# Patient Record
Sex: Female | Born: 1975 | Race: White | Hispanic: No | Marital: Married | Smoking: Former smoker
Health system: Southern US, Community
[De-identification: ages and names within clinical notes are randomized; demographics above are authoritative.]

## PROBLEM LIST (undated history)

## (undated) DIAGNOSIS — G629 Polyneuropathy, unspecified: Secondary | ICD-10-CM

## (undated) DIAGNOSIS — J209 Acute bronchitis, unspecified: Secondary | ICD-10-CM

## (undated) DIAGNOSIS — E559 Vitamin D deficiency, unspecified: Secondary | ICD-10-CM

## (undated) DIAGNOSIS — R55 Syncope and collapse: Secondary | ICD-10-CM

## (undated) DIAGNOSIS — I10 Essential (primary) hypertension: Secondary | ICD-10-CM

## (undated) DIAGNOSIS — E119 Type 2 diabetes mellitus without complications: Secondary | ICD-10-CM

## (undated) DIAGNOSIS — R42 Dizziness and giddiness: Secondary | ICD-10-CM

## (undated) DIAGNOSIS — R071 Chest pain on breathing: Secondary | ICD-10-CM

## (undated) DIAGNOSIS — M40209 Unspecified kyphosis, site unspecified: Secondary | ICD-10-CM

## (undated) DIAGNOSIS — T8859XA Other complications of anesthesia, initial encounter: Secondary | ICD-10-CM

## (undated) DIAGNOSIS — R0602 Shortness of breath: Secondary | ICD-10-CM

## (undated) DIAGNOSIS — K469 Unspecified abdominal hernia without obstruction or gangrene: Secondary | ICD-10-CM

## (undated) DIAGNOSIS — N939 Abnormal uterine and vaginal bleeding, unspecified: Secondary | ICD-10-CM

## (undated) DIAGNOSIS — M069 Rheumatoid arthritis, unspecified: Secondary | ICD-10-CM

## (undated) HISTORY — DX: Chest pain on breathing: R07.1

## (undated) HISTORY — DX: Abnormal uterine and vaginal bleeding, unspecified: N93.9

## (undated) HISTORY — DX: Morbid (severe) obesity due to excess calories: E66.01

## (undated) HISTORY — DX: Acute bronchitis, unspecified: J20.9

## (undated) HISTORY — DX: Dizziness and giddiness: R42

## (undated) HISTORY — PX: SKIN TAG REMOVAL: SHX780

## (undated) HISTORY — PX: APPENDECTOMY: SHX54

## (undated) HISTORY — PX: OTHER SURGICAL HISTORY: SHX169

## (undated) HISTORY — DX: Polyneuropathy, unspecified: G62.9

## (undated) HISTORY — DX: Shortness of breath: R06.02

## (undated) HISTORY — DX: Vitamin D deficiency, unspecified: E55.9

## (undated) HISTORY — PX: CHOLECYSTECTOMY: SHX55

## (undated) HISTORY — PX: HERNIA REPAIR: SHX51

## (undated) HISTORY — PX: TONSILLECTOMY: SUR1361

## (undated) HISTORY — DX: Unspecified abdominal hernia without obstruction or gangrene: K46.9

## (undated) HISTORY — DX: Syncope and collapse: R55

---

## 2020-11-27 DIAGNOSIS — R55 Syncope and collapse: Secondary | ICD-10-CM

## 2020-11-29 DIAGNOSIS — R071 Chest pain on breathing: Secondary | ICD-10-CM | POA: Insufficient documentation

## 2020-11-29 DIAGNOSIS — R55 Syncope and collapse: Secondary | ICD-10-CM | POA: Insufficient documentation

## 2020-11-29 DIAGNOSIS — R0602 Shortness of breath: Secondary | ICD-10-CM | POA: Insufficient documentation

## 2020-11-29 DIAGNOSIS — J209 Acute bronchitis, unspecified: Secondary | ICD-10-CM | POA: Insufficient documentation

## 2020-11-29 DIAGNOSIS — R42 Dizziness and giddiness: Secondary | ICD-10-CM | POA: Insufficient documentation

## 2020-11-29 DIAGNOSIS — G629 Polyneuropathy, unspecified: Secondary | ICD-10-CM | POA: Insufficient documentation

## 2020-11-29 DIAGNOSIS — K469 Unspecified abdominal hernia without obstruction or gangrene: Secondary | ICD-10-CM | POA: Insufficient documentation

## 2020-11-29 DIAGNOSIS — N939 Abnormal uterine and vaginal bleeding, unspecified: Secondary | ICD-10-CM | POA: Insufficient documentation

## 2020-11-29 DIAGNOSIS — E559 Vitamin D deficiency, unspecified: Secondary | ICD-10-CM | POA: Insufficient documentation

## 2020-12-04 ENCOUNTER — Other Ambulatory Visit: Payer: Self-pay

## 2020-12-04 ENCOUNTER — Encounter: Payer: Self-pay | Admitting: Cardiology

## 2020-12-04 ENCOUNTER — Ambulatory Visit (INDEPENDENT_AMBULATORY_CARE_PROVIDER_SITE_OTHER): Payer: 59 | Admitting: Cardiology

## 2020-12-04 VITALS — BP 131/85 | HR 89 | Ht 66.0 in | Wt 251.0 lb

## 2020-12-04 DIAGNOSIS — R55 Syncope and collapse: Secondary | ICD-10-CM | POA: Diagnosis not present

## 2020-12-04 DIAGNOSIS — R079 Chest pain, unspecified: Secondary | ICD-10-CM | POA: Insufficient documentation

## 2020-12-04 DIAGNOSIS — I209 Angina pectoris, unspecified: Secondary | ICD-10-CM | POA: Diagnosis not present

## 2020-12-04 HISTORY — DX: Chest pain, unspecified: R07.9

## 2020-12-04 MED ORDER — ASPIRIN EC 81 MG PO TBEC: 81.0000 mg | DELAYED_RELEASE_TABLET | Freq: Every day | ORAL | 3 refills | Status: AC

## 2020-12-04 MED ORDER — NITROGLYCERIN 0.4 MG SL SUBL: 0.4000 mg | SUBLINGUAL_TABLET | SUBLINGUAL | 6 refills | Status: DC | PRN

## 2020-12-04 NOTE — Patient Instructions (Signed)
Medication Instructions:  Your physician has recommended you make the following change in your medication:   Take 81 mg coated aspirin daily. Use nitroglycerin as needed for chest pain.  *If you need a refill on your cardiac medications before your next appointment, please call your pharmacy*   Lab Work: Your physician recommends that you have a BMET and CBC today in the office for your upcoming procedure.  If you have labs (blood work) drawn today and your tests are completely normal, you will receive your results only by: Marland Kitchen MyChart Message (if you have MyChart) OR . A paper copy in the mail If you have any lab test that is abnormal or we need to change your treatment, we will call you to review the results.   Testing/Procedures:    [facility] [place] [address] [place] [address] Dept: [phone] Loc: [phone]  Sarah Rocha  12/04/2020  You are scheduled for a Cardiac Catheterization on Tuesday, February 15 with Dr. Nicki Guadalajara.  1. Please arrive at the [facility] (Main Entrance A) at [facility]: [address] [place], [place] [address] at 8:30 AM (This time is two hours before your procedure to ensure your preparation). Free valet parking service is available.   Special note: Every effort is made to have your procedure done on time. Please understand that emergencies sometimes delay scheduled procedures.  2. Diet: Do not eat solid foods after midnight.  The patient may have clear liquids until 5am upon the day of the procedure.  3. Labs: You had your labs done in the office today.  4. Medication instructions in preparation for your procedure:   Contrast Allergy: No  On the morning of your procedure, take your Aspirin and any morning medicines NOT listed above.  You may use sips of water.  5. Plan for one night stay--bring personal belongings. 6. Bring a current list of  your medications and current insurance cards. 7. You MUST have a responsible person to drive you home. 8. Someone MUST be with you the first 24 hours after you arrive home or your discharge will be delayed. 9. Please wear clothes that are easy to get on and off and wear slip-on shoes.  Thank you for allowing [place] to care for you!   -- [facility]    Follow-Up: At [facility], you and your health needs are our priority.  As part of our continuing mission to provide you with exceptional heart care, we have created designated Provider Care Teams.  These Care Teams include your primary Cardiologist (physician) and Advanced Practice Providers (APPs -  Physician Assistants and Nurse Practitioners) who all work together to provide you with the care you need, when you need it.  We recommend signing up for the patient portal called "MyChart".  Sign up information is provided on this After Visit Summary.  MyChart is used to connect with patients for Virtual Visits (Telemedicine).  Patients are able to view lab/test results, encounter notes, upcoming appointments, etc.  Non-urgent messages can be sent to your provider as well.   To learn more about what you can do with MyChart, go to [email or web address].    Your next appointment:   1 month(s)  The format for your next appointment:   In Person  Provider:   Belva Crome, MD   Other Instructions  Coronary Angiogram With Stent Coronary angiogram with stent placement is a procedure to  widen or open a narrow blood vessel of the heart (coronary artery). Arteries may become blocked by cholesterol buildup (plaques) in the lining of the artery wall. When a coronary artery becomes partially blocked, blood flow to that area decreases. This may lead to chest pain or a heart attack (myocardial infarction). A stent is a small piece of metal that looks like mesh or spring. Stent placement may be done as treatment after a  heart attack, or to prevent a heart attack if a blocked artery is found by a coronary angiogram. Let your health care provider know about:  Any allergies you have, including allergies to medicines or contrast dye.  All medicines you are taking, including vitamins, herbs, eye drops, creams, and over-the-counter medicines.  Any problems you or family members have had with anesthetic medicines.  Any blood disorders you have.  Any surgeries you have had.  Any medical conditions you have, including kidney problems or kidney failure.  Whether you are pregnant or may be pregnant.  Whether you are breastfeeding. What are the risks? Generally, this is a safe procedure. However, serious problems may occur, including:  Damage to nearby structures or organs, such as the heart, blood vessels, or kidneys.  A return of blockage.  Bleeding, infection, or bruising at the insertion site.  A collection of blood under the skin (hematoma) at the insertion site.  A blood clot in another part of the body.  Allergic reaction to medicines or dyes.  Bleeding into the abdomen (retroperitoneal bleeding).  Stroke (rare).  Heart attack (rare). What happens before the procedure? Staying hydrated Follow instructions from your health care provider about hydration, which may include:  Up to 2 hours before the procedure - you may continue to drink clear liquids, such as water, clear fruit juice, black coffee, and plain tea.   Eating and drinking restrictions Follow instructions from your health care provider about eating and drinking, which may include:  8 hours before the procedure - stop eating heavy meals or foods, such as meat, fried foods, or fatty foods.  6 hours before the procedure - stop eating light meals or foods, such as toast or cereal.  2 hours before the procedure - stop drinking clear liquids. Medicines Ask your health care provider about:  Changing or stopping your regular  medicines. This is especially important if you are taking diabetes medicines or blood thinners.  Taking medicines such as aspirin and ibuprofen. These medicines can thin your blood. Do not take these medicines unless your health care provider tells you to take them. ? Generally, aspirin is recommended before a thin tube, called a catheter, is passed through a blood vessel and inserted into the heart (cardiac catheterization).  Taking over-the-counter medicines, vitamins, herbs, and supplements. General instructions  Do not use any products that contain nicotine or tobacco for at least 4 weeks before the procedure. These products include cigarettes, e-cigarettes, and chewing tobacco. If you need help quitting, ask your health care provider.  Plan to have someone take you home from the hospital or clinic.  If you will be going home right after the procedure, plan to have someone with you for 24 hours.  You may have tests and imaging procedures.  Ask your health care provider: ? How your insertion site will be marked. Ask which artery will be used for the procedure. ? What steps will be taken to help prevent infection. These may include:  Removing hair at the insertion site.  Washing skin with  a germ-killing soap.  Taking antibiotic medicine. What happens during the procedure?  An IV will be inserted into one of your veins.  Electrodes may be placed on your chest to monitor your heart rate during the procedure.  You will be given one or more of the following: ? A medicine to help you relax (sedative). ? A medicine to numb the area (local anesthetic) for catheter insertion.  A small incision will be made for catheter insertion.  The catheter will be inserted into an artery using a guide wire. The location may be in your groin, your wrist, or the fold of your arm (near your elbow).  An X-ray procedure (fluoroscopy) will be used to help guide the catheter to the opening of the heart  arteries.  A dye will be injected into the catheter. X-rays will be taken. The dye helps to show where any narrowing or blockages are located in the arteries.  Tell your health care provider if you have chest pain or trouble breathing.  A tiny wire will be guided to the blocked spot, and a balloon will be inflated to make the artery wider.  The stent will be expanded to crush the plaques into the wall of the vessel. The stent will hold the area open and improve the blood flow. Most stents have a drug coating to reduce the risk of the stent narrowing over time.  The artery may be made wider using a drill, laser, or other tools that remove plaques.  The catheter will be removed when the blood flow improves. The stent will stay where it was placed, and the lining of the artery will grow over it.  A bandage (dressing) will be placed on the insertion site. Pressure will be applied to stop bleeding.  The IV will be removed. This procedure may vary among health care providers and hospitals.   What happens after the procedure?  Your blood pressure, heart rate, breathing rate, and blood oxygen level will be monitored until you leave the hospital or clinic.  If the procedure is done through the leg, you will lie flat in bed for a few hours or for as long as told by your health care provider. You will be instructed not to bend or cross your legs.  The insertion site and the pulse in your foot or wrist will be checked often.  You may have more blood tests, X-rays, and a test that records the electrical activity of your heart (electrocardiogram, or ECG).  Do not drive for 24 hours if you were given a sedative during your procedure. Summary  Coronary angiogram with stent placement is a procedure to widen or open a narrowed coronary artery. This is done to treat heart problems.  Before the procedure, let your health care provider know about all the medical conditions and surgeries you have or have  had.  This is a safe procedure. However, some problems may occur, including damage to nearby structures or organs, bleeding, blood clots, or allergies.  Follow your health care provider's instructions about eating, drinking, medicines, and other lifestyle changes, such as quitting tobacco use before the procedure. This information is not intended to replace advice given to you by your health care provider. Make sure you discuss any questions you have with your health care provider. Document Revised: 05/03/2019 Document Reviewed: 05/03/2019 Elsevier Patient Education  2021 Elsevier Inc.  Aspirin and Your Heart Aspirin is a medicine that prevents the platelets in your blood from sticking together. Platelets  are the cells that your blood uses for clotting. Aspirin can be used to help reduce the risk of blood clots, heart attacks, and other heart-related problems. What are the risks? Daily use of aspirin can cause side effects. Some of these include:  Bleeding. Bleeding can be minor or serious. An example of minor bleeding is bleeding from a cut, and the bleeding does not stop. An example of more serious bleeding is stomach bleeding or, rarely, bleeding into the brain. Your risk of bleeding increases if you are also taking NSAIDs, such as ibuprofen.  Increased bruising.  Upset stomach.  An allergic reaction. People who have growths inside the nose (nasal polyps) have an increased risk of developing an aspirin allergy. How to use aspirin to care for your heart  Take aspirin only as told by your health care provider. Make sure that you understand how much to take and what form to take. The two forms of aspirin are: ? Non-enteric-coated.This type of aspirin does not have a coating and is absorbed quickly. This type of aspirin also comes in a chewable form. ? Enteric-coated. This type of aspirin has a coating that releases the medicine very slowly. Enteric-coated aspirin might cause less stomach  upset than non-enteric-coated aspirin. This type of aspirin should not be chewed or crushed.  Work with your health care provider to find out whether it is safe and beneficial for you to take aspirin daily. Taking aspirin daily may be helpful if: ? You have had a heart attack or chest pain, or you are at risk for a heart attack. ? You have a condition in which certain heart vessels are blocked (coronary artery disease), and you have had a procedure to treat it. Examples are:  Open-heart surgery, such as coronary artery bypass surgery (CABG).  Coronary angioplasty,which is done to widen a blood vessel of your heart.  Having a small mesh tube, or stent, placed in your coronary artery. ? You have had certain types of stroke or a mini-stroke known as a transient ischemic attack (TIA). ? You have a narrowing of the arteries that supply the limbs (peripheral artery disease, or PAD). ? You have long-term (chronic) heart rhythm problems, such as atrial fibrillation, and your health care provider thinks aspirin may help. ? You have valve disease or have had surgery on a valve. ? You are considered at increased risk of developing coronary artery disease or PAD.   Follow these instructions at home Medicines  Take over-the-counter and prescription medicines only as told by your health care provider.  If you are taking blood thinners: ? Talk with your health care provider before you take any medicines that contain aspirin or NSAIDs, such as ibuprofen. These medicines increase your risk for dangerous bleeding. ? Take your medicine exactly as told, at the same time every day. ? Avoid activities that could cause injury or bruising, and follow instructions about how to prevent falls. ? Wear a medical alert bracelet or carry a card that lists what medicines you take. General instructions  Do not drink alcohol if: ? Your health care provider tells you not to drink. ? You are pregnant, may be pregnant, or  are planning to become pregnant.  If you drink alcohol: ? Limit how much you use to:  0-1 drink a day for women.  0-2 drinks a day for men. ? Be aware of how much alcohol is in your drink. In the [place] one drink equals one 12 oz bottle of beer (  355 mL), one 5 oz glass of wine (148 mL), or one 1 oz glass of hard liquor (44 mL).  Keep all follow-up visits as told by your health care provider. This is important. Where to find more information  The American Heart Association: www.heart.org Contact a health care provider if you have:  Unusual bleeding or bruising.  Stomach pain or nausea.  Ringing in your ears.  An allergic reaction that causes hives, itchy skin, or swelling of the lips, tongue, or face. Get help right away if:  You notice that your bowel movements are bloody, or dark red or black in color.  You vomit or cough up blood.  You have blood in your urine.  You cough, breathe loudly (wheeze), or feel short of breath.  You have chest pain, especially if the pain spreads to your arms, back, neck, or jaw.  You have a headache with confusion. You have any symptoms of a stroke. "BE FAST" is an easy way to remember the main warning signs of a stroke:  B - Balance. Signs are dizziness, sudden trouble walking, or loss of balance.  E - Eyes. Signs are trouble seeing or a sudden change in vision.  F - Face. Signs are sudden weakness or numbness of the face, or the face or eyelid drooping on one side.  A - Arms. Signs are weakness or numbness in an arm. This happens suddenly and usually on one side of the body.  S - Speech. Signs are sudden trouble speaking, slurred speech, or trouble understanding what people say.  T - Time. Time to call emergency services. Write down what time symptoms started. You have other signs of a stroke, such as:  A sudden, severe headache with no known cause.  Nausea or vomiting.  Seizure. These symptoms may represent a serious problem  that is an emergency. Do not wait to see if the symptoms will go away. Get medical help right away. Call your local emergency services (911 in the [place] Do not drive yourself to the hospital. Summary  Aspirin use can help reduce the risk of blood clots, heart attacks, and other heart-related problems.  Daily use of aspirin can cause side effects.  Take aspirin only as told by your health care provider. Make sure that you understand how much to take and what form to take.  Your health care provider will help you determine whether it is safe and beneficial for you to take aspirin daily. This information is not intended to replace advice given to you by your health care provider. Make sure you discuss any questions you have with your health care provider. Document Revised: 07/17/2019 Document Reviewed: 07/17/2019 Elsevier Patient Education  2021 Elsevier Inc. Nitroglycerin sublingual tablets What is this medicine? NITROGLYCERIN (nye troe GLI ser in) is a type of vasodilator. It relaxes blood vessels, increasing the blood and oxygen supply to your heart. This medicine is used to relieve chest pain caused by angina. It is also used to prevent chest pain before activities like climbing stairs, going outdoors in cold weather, or sexual activity. This medicine may be used for other purposes; ask your health care provider or pharmacist if you have questions. COMMON BRAND NAME(S): Nitroquick, Nitrostat, Nitrotab What should I tell my health care provider before I take this medicine? They need to know if you have any of these conditions:  anemia  head injury, recent stroke, or bleeding in the brain  liver disease  previous heart attack  an unusual  or allergic reaction to nitroglycerin, other medicines, foods, dyes, or preservatives  pregnant or trying to get pregnant  breast-feeding How should I use this medicine? Take this medicine by mouth as needed. Use at the first sign of an angina  attack (chest pain or tightness). You can also take this medicine 5 to 10 minutes before an event likely to produce chest pain. Follow the directions exactly as written on the prescription label. Place one tablet under your tongue and let it dissolve. Do not swallow whole. Replace the dose if you accidentally swallow it. It will help if your mouth is not dry. Saliva around the tablet will help it to dissolve more quickly. Do not eat or drink, smoke or chew tobacco while a tablet is dissolving. Sit down when taking this medicine. In an angina attack, you should feel better within 5 minutes after your first dose. You can take a dose every 5 minutes up to a total of 3 doses. If you do not feel better or feel worse after 1 dose, call 9-1-1 at once. Do not take more than 3 doses in 15 minutes. Your health care provider might give you other directions. Follow those directions if he or she does. Do not take your medicine more often than directed. Talk to your health care provider about the use of this medicine in children. Special care may be needed. Overdosage: If you think you have taken too much of this medicine contact a poison control center or emergency room at once. NOTE: This medicine is only for you. Do not share this medicine with others. What if I miss a dose? This does not apply. This medicine is only used as needed. What may interact with this medicine? Do not take this medicine with any of the following medications:  certain migraine medicines like ergotamine and dihydroergotamine (DHE)  medicines used to treat erectile dysfunction like sildenafil, tadalafil, and vardenafil  riociguat This medicine may also interact with the following medications:  alteplase  aspirin  heparin  medicines for high blood pressure  medicines for mental depression  other medicines used to treat angina  phenothiazines like chlorpromazine, mesoridazine, prochlorperazine, thioridazine This list may not  describe all possible interactions. Give your health care provider a list of all the medicines, herbs, non-prescription drugs, or dietary supplements you use. Also tell them if you smoke, drink alcohol, or use illegal drugs. Some items may interact with your medicine. What should I watch for while using this medicine? Tell your doctor or health care professional if you feel your medicine is no longer working. Keep this medicine with you at all times. Sit or lie down when you take your medicine to prevent falling if you feel dizzy or faint after using it. Try to remain calm. This will help you to feel better faster. If you feel dizzy, take several deep breaths and lie down with your feet propped up, or bend forward with your head resting between your knees. You may get drowsy or dizzy. Do not drive, use machinery, or do anything that needs mental alertness until you know how this drug affects you. Do not stand or sit up quickly, especially if you are an older patient. This reduces the risk of dizzy or fainting spells. Alcohol can make you more drowsy and dizzy. Avoid alcoholic drinks. Do not treat yourself for coughs, colds, or pain while you are taking this medicine without asking your doctor or health care professional for advice. Some ingredients may increase your  blood pressure. What side effects may I notice from receiving this medicine? Side effects that you should report to your doctor or health care professional as soon as possible:  allergic reactions (skin rash, itching or hives; swelling of the face, lips, or tongue)  low blood pressure (dizziness; feeling faint or lightheaded, falls; unusually weak or tired)  low red blood cell counts (trouble breathing; feeling faint; lightheaded, falls; unusually weak or tired) Side effects that usually do not require medical attention (report to your doctor or health care professional if they continue or are bothersome):  facial flushing  (redness)  headache  nausea, vomiting This list may not describe all possible side effects. Call your doctor for medical advice about side effects. You may report side effects to FDA at 1-800-FDA-1088. Where should I keep my medicine? Keep out of the reach of children. Store at room temperature between 20 and 25 degrees C (68 and 77 degrees F). Store in Retail buyer. Protect from light and moisture. Keep tightly closed. Throw away any unused medicine after the expiration date. NOTE: This sheet is a summary. It may not cover all possible information. If you have questions about this medicine, talk to your doctor, pharmacist, or health care provider.  2021 Elsevier/Gold Standard (2018-07-13 16:46:32)

## 2020-12-04 NOTE — Progress Notes (Addendum)
Cardiology Office Note:    Date:  12/04/2020   ID:  Sarah Rocha, DOB [birth year], MRN [id number]  PCP:  Yvonne Kendall, NP  Cardiologist:  Garwin Brothers, MD   Referring MD: Yvonne Kendall, NP    ASSESSMENT:    1. Syncope and collapse   2. Angina pectoris (HCC)   3. Morbid obesity (HCC)    PLAN:    In order of problems listed above:  1. Angina pectoris: Patient symptoms are very significant and following recommendations were made to the patient.  Sublingual nitroglycerin prescription was sent, its protocol and 911 protocol explained and the patient vocalized understanding questions were answered to the patient's satisfaction.  She was advised to take a coated baby aspirin on a daily basis.In view of the patient's symptoms, I discussed with the patient options for evaluation. Invasive and noninvasive options were given to the patient. I discussed stress testing and coronary angiography and left heart catheterization at length. Benefits, pros and cons of each approach were discussed at length. Patient had multiple questions which were answered to the patient's satisfaction. Patient opted for invasive evaluation and we will set up for coronary angiography and left heart catheterization. Further recommendations will be made based on the findings with coronary angiography. In the interim if the patient has any significant symptoms in hospital to the nearest emergency room.  I also discussed CT coronary angiography with FFR and she is working on this modality. 2. Morbid obesity: Diet was emphasized.  Weight reduction.  Stressed and she promises to do better.  Risks of obesity explained and she promises to do better. 3. Ex-smoker: She promises never to go back to smoking. 4. She has had blood work done by primary care provider lipids are followed by them.  Diet was emphasized.  She will be seen follow-up appointment after the coronary angiography.  Addendum 01/04/2019 Patient came for  follow-up visit with me today and I reviewed my previous note and realized that is a typo error.  I am putting an addendum to that note as a correction.  The correct sentence must be: I also discussed CT angiography with FFR and she is not keen on this modality. Signed: Dr. Glean Hess Doral Digangi 01/04/2019 at 9:11 AM   Medication Adjustments/Labs and Tests Ordered: Current medicines are reviewed at length with the patient today.  Concerns regarding medicines are outlined above.  No orders of the defined types were placed in this encounter.  No orders of the defined types were placed in this encounter.    History of Present Illness:    Sarah Rocha is a 45 y.o. female who is being seen today for the evaluation of chest pain and chest tightness at the request of Yvonne Kendall, NP.  Patient is a pleasant 45 year old female.  She has past medical history of morbid obesity.  She is under: Gastric surgery for weight loss and has been successful with this.  She is an ex-smoker.  She has had history of cardiomyopathy and coronary angiography in the remote past in [place].  We do not have those details.  She mentions to me now that when she exerts herself she has chest tightness radiating to the left arm.  She is passed out on one occasion.  No orthopnea or PND.  For this reason she was sent here for evaluation.  She leads a sedentary lifestyle.  At the time of my evaluation, the patient is alert awake oriented and in no distress.  Past  Medical History:  Diagnosis Date  . Abdominal hernia   . Abnormal uterine and vaginal bleeding, unspecified   . Acute bronchitis, unspecified   . Chest pain on breathing   . Lightheadedness   . Morbid obesity (HCC)   . Neuropathy   . Shortness of breath   . Syncope and collapse   . Vitamin D deficiency     Past Surgical History:  Procedure Laterality Date  . APPENDECTOMY    . CESAREAN SECTION    . CHOLECYSTECTOMY    . gastric bypass sleeve    . HERNIA REPAIR     . SKIN TAG REMOVAL    . TONSILLECTOMY      Current Medications: Current Meds  Medication Sig  . albuterol (VENTOLIN HFA) 108 (90 Base) MCG/ACT inhaler Inhale 2 puffs into the lungs every 6 (six) hours as needed for wheezing.  . Cholecalciferol (VITAMIN D3) 1.25 MG (50000 UT) CAPS Take 50,000 Units by mouth once a week.  . gabapentin (NEURONTIN) 400 MG capsule Take 400 mg by mouth at bedtime.     Allergies:   Patient has no known allergies.   Social History   Socioeconomic History  . Marital status: Single    Spouse name: Not on file  . Number of children: Not on file  . Years of education: Not on file  . Highest education level: Not on file  Occupational History  . Not on file  Tobacco Use  . Smoking status: Former Games developer  . Smokeless tobacco: Never Used  Substance and Sexual Activity  . Alcohol use: Not on file  . Drug use: Not on file  . Sexual activity: Not on file  Other Topics Concern  . Not on file  Social History Narrative  . Not on file   Social Determinants of Health   Financial Resource Strain: Not on file  Food Insecurity: Not on file  Transportation Needs: Not on file  Physical Activity: Not on file  Stress: Not on file  Social Connections: Not on file     Family History: The patient's family history includes Diabetes in her father and mother; Heart attack in her father and mother; Hypertension in her father.  ROS:   Please see the history of present illness.    All other systems reviewed and are negative.  EKGs/Labs/Other Studies Reviewed:    The following studies were reviewed today: I discussed my findings with the patient at length.  EKG reveals sinus rhythm and nonspecific ST-T changes.   Recent Labs: No results found for requested labs within last 8760 hours.  Recent Lipid Panel No results found for: CHOL, TRIG, HDL, CHOLHDL, VLDL, LDLCALC, LDLDIRECT  Physical Exam:    VS:  BP 131/85   Pulse 89   Ht 5\' 6"  (1.676 m)   Wt 251 lb  (113.9 kg)   SpO2 95%   BMI 40.51 kg/m     Wt Readings from Last 3 Encounters:  12/04/20 251 lb (113.9 kg)     GEN: Patient is in no acute distress HEENT: Normal NECK: No JVD; No carotid bruits LYMPHATICS: No lymphadenopathy CARDIAC: S1 S2 regular, 2/6 systolic murmur at the apex. RESPIRATORY:  Clear to auscultation without rales, wheezing or rhonchi  ABDOMEN: Soft, non-tender, non-distended MUSCULOSKELETAL:  No edema; No deformity  SKIN: Warm and dry NEUROLOGIC:  Alert and oriented x 3 PSYCHIATRIC:  Normal affect    Signed, 02/01/21, MD  12/04/2020 10:08 AM    [facility]

## 2020-12-04 NOTE — H&P (View-Only) (Signed)
Cardiology Office Note:    Date:  12/04/2020   ID:  Sarah Rocha, DOB [birth year], MRN [id number]  PCP:  Yvonne Kendall, NP  Cardiologist:  Garwin Brothers, MD   Referring MD: Yvonne Kendall, NP    ASSESSMENT:    1. Syncope and collapse   2. Angina pectoris (HCC)   3. Morbid obesity (HCC)    PLAN:    In order of problems listed above:  1. Angina pectoris: Patient symptoms are very significant and following recommendations were made to the patient.  Sublingual nitroglycerin prescription was sent, its protocol and 911 protocol explained and the patient vocalized understanding questions were answered to the patient's satisfaction.  She was advised to take a coated baby aspirin on a daily basis.In view of the patient's symptoms, I discussed with the patient options for evaluation. Invasive and noninvasive options were given to the patient. I discussed stress testing and coronary angiography and left heart catheterization at length. Benefits, pros and cons of each approach were discussed at length. Patient had multiple questions which were answered to the patient's satisfaction. Patient opted for invasive evaluation and we will set up for coronary angiography and left heart catheterization. Further recommendations will be made based on the findings with coronary angiography. In the interim if the patient has any significant symptoms in hospital to the nearest emergency room.  I also discussed CT coronary angiography with FFR and she is working on this modality. 2. Morbid obesity: Diet was emphasized.  Weight reduction.  Stressed and she promises to do better.  Risks of obesity explained and she promises to do better. 3. Ex-smoker: She promises never to go back to smoking. 4. She has had blood work done by primary care provider lipids are followed by them.  Diet was emphasized.  She will be seen follow-up appointment after the coronary angiography.   Medication Adjustments/Labs and Tests  Ordered: Current medicines are reviewed at length with the patient today.  Concerns regarding medicines are outlined above.  No orders of the defined types were placed in this encounter.  No orders of the defined types were placed in this encounter.    History of Present Illness:    Sarah Rocha is a 45 y.o. female who is being seen today for the evaluation of chest pain and chest tightness at the request of Yvonne Kendall, NP.  Patient is a pleasant 45 year old female.  She has past medical history of morbid obesity.  She is under: Gastric surgery for weight loss and has been successful with this.  She is an ex-smoker.  She has had history of cardiomyopathy and coronary angiography in the remote past in [place].  We do not have those details.  She mentions to me now that when she exerts herself she has chest tightness radiating to the left arm.  She is passed out on one occasion.  No orthopnea or PND.  For this reason she was sent here for evaluation.  She leads a sedentary lifestyle.  At the time of my evaluation, the patient is alert awake oriented and in no distress.  Past Medical History:  Diagnosis Date  . Abdominal hernia   . Abnormal uterine and vaginal bleeding, unspecified   . Acute bronchitis, unspecified   . Chest pain on breathing   . Lightheadedness   . Morbid obesity (HCC)   . Neuropathy   . Shortness of breath   . Syncope and collapse   . Vitamin D deficiency  Past Surgical History:  Procedure Laterality Date  . APPENDECTOMY    . CESAREAN SECTION    . CHOLECYSTECTOMY    . gastric bypass sleeve    . HERNIA REPAIR    . SKIN TAG REMOVAL    . TONSILLECTOMY      Current Medications: Current Meds  Medication Sig  . albuterol (VENTOLIN HFA) 108 (90 Base) MCG/ACT inhaler Inhale 2 puffs into the lungs every 6 (six) hours as needed for wheezing.  . Cholecalciferol (VITAMIN D3) 1.25 MG (50000 UT) CAPS Take 50,000 Units by mouth once a week.  . gabapentin  (NEURONTIN) 400 MG capsule Take 400 mg by mouth at bedtime.     Allergies:   Patient has no known allergies.   Social History   Socioeconomic History  . Marital status: Single    Spouse name: Not on file  . Number of children: Not on file  . Years of education: Not on file  . Highest education level: Not on file  Occupational History  . Not on file  Tobacco Use  . Smoking status: Former Games developer  . Smokeless tobacco: Never Used  Substance and Sexual Activity  . Alcohol use: Not on file  . Drug use: Not on file  . Sexual activity: Not on file  Other Topics Concern  . Not on file  Social History Narrative  . Not on file   Social Determinants of Health   Financial Resource Strain: Not on file  Food Insecurity: Not on file  Transportation Needs: Not on file  Physical Activity: Not on file  Stress: Not on file  Social Connections: Not on file     Family History: The patient's family history includes Diabetes in her father and mother; Heart attack in her father and mother; Hypertension in her father.  ROS:   Please see the history of present illness.    All other systems reviewed and are negative.  EKGs/Labs/Other Studies Reviewed:    The following studies were reviewed today: I discussed my findings with the patient at length.  EKG reveals sinus rhythm and nonspecific ST-T changes.   Recent Labs: No results found for requested labs within last 8760 hours.  Recent Lipid Panel No results found for: CHOL, TRIG, HDL, CHOLHDL, VLDL, LDLCALC, LDLDIRECT  Physical Exam:    VS:  BP 131/85   Pulse 89   Ht 5\' 6"  (1.676 m)   Wt 251 lb (113.9 kg)   SpO2 95%   BMI 40.51 kg/m     Wt Readings from Last 3 Encounters:  12/04/20 251 lb (113.9 kg)     GEN: Patient is in no acute distress HEENT: Normal NECK: No JVD; No carotid bruits LYMPHATICS: No lymphadenopathy CARDIAC: S1 S2 regular, 2/6 systolic murmur at the apex. RESPIRATORY:  Clear to auscultation without rales,  wheezing or rhonchi  ABDOMEN: Soft, non-tender, non-distended MUSCULOSKELETAL:  No edema; No deformity  SKIN: Warm and dry NEUROLOGIC:  Alert and oriented x 3 PSYCHIATRIC:  Normal affect    Signed, 02/01/21, MD  12/04/2020 10:08 AM    [facility]

## 2020-12-05 LAB — CBC WITH DIFFERENTIAL/PLATELET
Basophils Absolute: 0.1 10*3/uL (ref 0.0–0.2)
Basos: 1 %
EOS (ABSOLUTE): 0.2 10*3/uL (ref 0.0–0.4)
Eos: 2 %
Hematocrit: 42.8 % (ref 34.0–46.6)
Hemoglobin: 13.7 g/dL (ref 11.1–15.9)
Immature Grans (Abs): 0.1 10*3/uL (ref 0.0–0.1)
Immature Granulocytes: 1 %
Lymphocytes Absolute: 2.3 10*3/uL (ref 0.7–3.1)
Lymphs: 27 %
MCH: 27.4 pg (ref 26.6–33.0)
MCHC: 32 g/dL (ref 31.5–35.7)
MCV: 86 fL (ref 79–97)
Monocytes Absolute: 0.9 10*3/uL (ref 0.1–0.9)
Monocytes: 10 %
Neutrophils Absolute: 5.2 10*3/uL (ref 1.4–7.0)
Neutrophils: 59 %
Platelets: 220 10*3/uL (ref 150–450)
RBC: 5 x10E6/uL (ref 3.77–5.28)
RDW: 14.3 % (ref 11.7–15.4)
WBC: 8.7 10*3/uL (ref 3.4–10.8)

## 2020-12-05 LAB — BASIC METABOLIC PANEL
BUN/Creatinine Ratio: 13 (ref 9–23)
BUN: 10 mg/dL (ref 6–24)
CO2: 23 mmol/L (ref 20–29)
Calcium: 9.7 mg/dL (ref 8.7–10.2)
Chloride: 104 mmol/L (ref 96–106)
Creatinine, Ser: 0.79 mg/dL (ref 0.57–1.00)
GFR calc Af Amer: 106 mL/min/{1.73_m2} (ref >59)
GFR calc non Af Amer: 92 mL/min/{1.73_m2} (ref >59)
Glucose: 87 mg/dL (ref 65–99)
Potassium: 4.5 mmol/L (ref 3.5–5.2)
Sodium: 141 mmol/L (ref 134–144)

## 2020-12-05 LAB — SPECIMEN STATUS REPORT

## 2020-12-07 ENCOUNTER — Other Ambulatory Visit (HOSPITAL_COMMUNITY)
Admission: RE | Admit: 2020-12-07 | Discharge: 2020-12-07 | Disposition: A | Discharge status: Home / Self Care | Payer: 59 | Source: Ambulatory Visit | Attending: Cardiovascular Disease | Admitting: Cardiovascular Disease

## 2020-12-07 DIAGNOSIS — Z20822 Contact with and (suspected) exposure to covid-19: Secondary | ICD-10-CM | POA: Diagnosis not present

## 2020-12-07 DIAGNOSIS — Z01812 Encounter for preprocedural laboratory examination: Secondary | ICD-10-CM | POA: Insufficient documentation

## 2020-12-08 LAB — SARS CORONAVIRUS 2 (TAT 6-24 HRS): SARS Coronavirus 2: NEGATIVE

## 2020-12-09 NOTE — Progress Notes (Signed)
Attempted to call patient regarding procedure instructions for tomorrow.  Left voice mail with the following instructions: Arrival time is 8:00 Nothing to eat or drink after midnight Need responsible adult to drive you home tomorrow as well as someone to stay overnight with you. Ok to take all am medications, including the baby asa

## 2020-12-10 ENCOUNTER — Encounter (HOSPITAL_COMMUNITY)
Admission: RE | Disposition: A | Discharge status: Home / Self Care | Payer: Self-pay | Source: Home / Self Care | Attending: Cardiovascular Disease

## 2020-12-10 ENCOUNTER — Other Ambulatory Visit: Payer: Self-pay

## 2020-12-10 ENCOUNTER — Ambulatory Visit (HOSPITAL_COMMUNITY)
Admission: RE | Admit: 2020-12-10 | Discharge: 2020-12-10 | Disposition: A | Discharge status: Home / Self Care | Payer: 59 | Attending: Cardiovascular Disease | Admitting: Cardiovascular Disease

## 2020-12-10 DIAGNOSIS — Z87891 Personal history of nicotine dependence: Secondary | ICD-10-CM | POA: Insufficient documentation

## 2020-12-10 DIAGNOSIS — I209 Angina pectoris, unspecified: Secondary | ICD-10-CM | POA: Insufficient documentation

## 2020-12-10 DIAGNOSIS — R079 Chest pain, unspecified: Secondary | ICD-10-CM

## 2020-12-10 DIAGNOSIS — Z79899 Other long term (current) drug therapy: Secondary | ICD-10-CM | POA: Insufficient documentation

## 2020-12-10 DIAGNOSIS — R55 Syncope and collapse: Secondary | ICD-10-CM | POA: Diagnosis not present

## 2020-12-10 DIAGNOSIS — I429 Cardiomyopathy, unspecified: Secondary | ICD-10-CM | POA: Diagnosis not present

## 2020-12-10 DIAGNOSIS — Z6841 Body Mass Index (BMI) 40.0 and over, adult: Secondary | ICD-10-CM | POA: Diagnosis not present

## 2020-12-10 HISTORY — PX: LEFT HEART CATH AND CORONARY ANGIOGRAPHY: CATH118249

## 2020-12-10 SURGERY — LEFT HEART CATH AND CORONARY ANGIOGRAPHY
Anesthesia: LOCAL

## 2020-12-10 MED ORDER — LIDOCAINE HCL (PF) 1 % IJ SOLN
INTRAMUSCULAR | Status: AC
  Filled 2020-12-10: qty 30

## 2020-12-10 MED ORDER — OXYCODONE-ACETAMINOPHEN 5-325 MG PO TABS
1.0000 | ORAL_TABLET | Freq: Once | ORAL | Status: AC
  Filled 2020-12-10: qty 1

## 2020-12-10 MED ORDER — VERAPAMIL HCL 2.5 MG/ML IV SOLN
INTRAVENOUS | Status: AC
  Filled 2020-12-10: qty 2

## 2020-12-10 MED ORDER — SODIUM CHLORIDE 0.9% FLUSH: 3.0000 mL | Freq: Two times a day (BID) | INTRAVENOUS | Status: DC

## 2020-12-10 MED ORDER — FENTANYL CITRATE (PF) 100 MCG/2ML IJ SOLN
INTRAMUSCULAR | Status: AC
  Filled 2020-12-10: qty 2

## 2020-12-10 MED ORDER — SODIUM CHLORIDE 0.9 % IV SOLN: 250.0000 mL | INTRAVENOUS | Status: DC | PRN

## 2020-12-10 MED ORDER — HEPARIN (PORCINE) IN NACL 1000-0.9 UT/500ML-% IV SOLN
INTRAVENOUS | Status: AC
  Filled 2020-12-10: qty 500

## 2020-12-10 MED ORDER — SODIUM CHLORIDE 0.9 % WEIGHT BASED INFUSION: 1.0000 mL/kg/h | INTRAVENOUS | Status: DC

## 2020-12-10 MED ORDER — SODIUM CHLORIDE 0.9 % WEIGHT BASED INFUSION: 3.0000 mL/kg/h | INTRAVENOUS | Status: AC

## 2020-12-10 MED ORDER — IOHEXOL 350 MG/ML SOLN
INTRAVENOUS | Status: DC | PRN
  Administered 2020-12-10: 50 mL

## 2020-12-10 MED ORDER — HEPARIN SODIUM (PORCINE) 1000 UNIT/ML IJ SOLN
INTRAMUSCULAR | Status: DC | PRN
  Administered 2020-12-10: 5500 [IU] via INTRAVENOUS

## 2020-12-10 MED ORDER — SODIUM CHLORIDE 0.9% FLUSH: 3.0000 mL | INTRAVENOUS | Status: DC | PRN

## 2020-12-10 MED ORDER — MIDAZOLAM HCL 2 MG/2ML IJ SOLN
INTRAMUSCULAR | Status: DC | PRN
  Administered 2020-12-10: 2 mg via INTRAVENOUS
  Administered 2020-12-10: 1 mg via INTRAVENOUS

## 2020-12-10 MED ORDER — SODIUM CHLORIDE 0.9 % IV SOLN
250.0000 mL | INTRAVENOUS | Status: DC | PRN
Start: 2020-12-10 — End: 2020-12-11

## 2020-12-10 MED ORDER — DIAZEPAM 5 MG PO TABS: 5.0000 mg | ORAL_TABLET | Freq: Four times a day (QID) | ORAL | Status: DC | PRN

## 2020-12-10 MED ORDER — LABETALOL HCL 5 MG/ML IV SOLN: 10.0000 mg | INTRAVENOUS | Status: DC | PRN

## 2020-12-10 MED ORDER — LIDOCAINE HCL (PF) 1 % IJ SOLN
INTRAMUSCULAR | Status: DC | PRN
  Administered 2020-12-10: 2 mL

## 2020-12-10 MED ORDER — SODIUM CHLORIDE 0.9 % IV SOLN: INTRAVENOUS | Status: AC

## 2020-12-10 MED ORDER — MIDAZOLAM HCL 2 MG/2ML IJ SOLN
INTRAMUSCULAR | Status: AC
  Filled 2020-12-10: qty 2

## 2020-12-10 MED ORDER — ACETAMINOPHEN 325 MG PO TABS: 650.0000 mg | ORAL_TABLET | ORAL | Status: DC | PRN

## 2020-12-10 MED ORDER — ONDANSETRON HCL 4 MG/2ML IJ SOLN: 4.0000 mg | Freq: Four times a day (QID) | INTRAMUSCULAR | Status: DC | PRN

## 2020-12-10 MED ORDER — OXYCODONE-ACETAMINOPHEN 5-325 MG PO TABS
ORAL_TABLET | ORAL | Status: AC
  Administered 2020-12-10: 1 via ORAL
  Filled 2020-12-10: qty 1

## 2020-12-10 MED ORDER — VERAPAMIL HCL 2.5 MG/ML IV SOLN
INTRAVENOUS | Status: DC | PRN
  Administered 2020-12-10: 10 mL via INTRA_ARTERIAL

## 2020-12-10 MED ORDER — HEPARIN (PORCINE) IN NACL 1000-0.9 UT/500ML-% IV SOLN
INTRAVENOUS | Status: DC | PRN
  Administered 2020-12-10 (×2): 500 mL

## 2020-12-10 MED ORDER — ASPIRIN 81 MG PO CHEW: 81.0000 mg | CHEWABLE_TABLET | ORAL | Status: DC

## 2020-12-10 MED ORDER — FENTANYL CITRATE (PF) 100 MCG/2ML IJ SOLN
INTRAMUSCULAR | Status: DC | PRN
  Administered 2020-12-10: 50 ug via INTRAVENOUS
  Administered 2020-12-10: 25 ug via INTRAVENOUS

## 2020-12-10 MED ORDER — ACETAMINOPHEN 500 MG PO TABS
1000.0000 mg | ORAL_TABLET | Freq: Four times a day (QID) | ORAL | Status: DC | PRN
  Administered 2020-12-10: 1000 mg via ORAL

## 2020-12-10 MED ORDER — HEPARIN SODIUM (PORCINE) 1000 UNIT/ML IJ SOLN
INTRAMUSCULAR | Status: AC
  Filled 2020-12-10: qty 1

## 2020-12-10 MED ORDER — HYDRALAZINE HCL 20 MG/ML IJ SOLN
10.0000 mg | INTRAMUSCULAR | Status: DC | PRN
Start: 2020-12-10 — End: 2020-12-10

## 2020-12-10 SURGICAL SUPPLY — 13 items
BAG SNAP BAND KOVER 36X36 (MISCELLANEOUS) ×2 IMPLANT
CATH INFINITI JR4 5F (CATHETERS) ×2 IMPLANT
CATH OPTITORQUE TIG 4.0 5F (CATHETERS) ×2 IMPLANT
COVER DOME SNAP 22 D (MISCELLANEOUS) ×2 IMPLANT
DEVICE RAD COMP TR BAND LRG (VASCULAR PRODUCTS) ×2 IMPLANT
GLIDESHEATH SLEND SS 6F .021 (SHEATH) ×2 IMPLANT
GUIDEWIRE INQWIRE 1.5J.035X260 (WIRE) ×1 IMPLANT
INQWIRE 1.5J .035X260CM (WIRE) ×2
KIT HEART LEFT (KITS) ×2 IMPLANT
PACK CARDIAC CATHETERIZATION (CUSTOM PROCEDURE TRAY) ×2 IMPLANT
SHEATH PROBE COVER 6X72 (BAG) ×2 IMPLANT
TRANSDUCER W/STOPCOCK (MISCELLANEOUS) ×2 IMPLANT
TUBING CIL FLEX 10 FLL-RA (TUBING) ×2 IMPLANT

## 2020-12-10 NOTE — Progress Notes (Signed)
Pt is much more comfortable now and reports pain is down to a 4. Con to deflate band per protocol.

## 2020-12-10 NOTE — Progress Notes (Signed)
   Pt c/o nerve pain running up her arm from the cath site.  Slightly better w/ Percocet, but says it does better if she can keep it elevated.  Plan: Give tylenol 1000 mg  Sling to keep it elevated for now Take home gabapentin as scheduled Call if sx do not improve in 24-48 hr.  She prefers rx and d/c to staying overnight.  Theodore Demark, PA-C 12/10/2020 6:03 PM

## 2020-12-10 NOTE — Progress Notes (Signed)
Pt arrived from cath lab crying. States pain in (R) lower arm and hand is 9 out of 10 stabbing  Pain. (R) hand dusky. Okey Regal in cath lab called to assess and states hand looks ok. 1 cc of air removed from TRB.  Color improved but pain did not.  Dr Tresa Endo notified and asked to come see pt.

## 2020-12-10 NOTE — Discharge Instructions (Signed)
Radial Site Care  This sheet gives you information about how to care for yourself after your procedure. Your health care provider may also give you more specific instructions. If you have problems or questions, contact your health care provider. What can I expect after the procedure? After the procedure, it is common to have:  Bruising and tenderness at the catheter insertion area. Follow these instructions at home: Medicines  Take over-the-counter and prescription medicines only as told by your health care provider. Insertion site care 1. Follow instructions from your health care provider about how to take care of your insertion site. Make sure you: ? Wash your hands with soap and water before you remove your bandage (dressing). If soap and water are not available, use hand sanitizer. ? May remove dressing in 24 hours. 2. Check your insertion site every day for signs of infection. Check for: ? Redness, swelling, or pain. ? Fluid or blood. ? Pus or a bad smell. ? Warmth. 3. Do no take baths, swim, or use a hot tub for 5 days. 4. You may shower 24-48 hours after the procedure. ? Remove the dressing and gently wash the site with plain soap and water. ? Pat the area dry with a clean towel. ? Do not rub the site. That could cause bleeding. 5. Do not apply powder or lotion to the site. Activity  1. For 24 hours after the procedure, or as directed by your health care provider: ? Do not flex or bend the affected arm. ? Do not push or pull heavy objects with the affected arm. ? Do not drive yourself home from the hospital or clinic. You may drive 24 hours after the procedure. ? Do not operate machinery or power tools. ? KEEP ARM ELEVATED THE REMAINDER OF THE DAY. 2. Do not push, pull or lift anything that is heavier than 10 lb for 5 days. 3. Ask your health care provider when it is okay to: ? Return to work or school. ? Resume usual physical activities or sports. ? Resume sexual  activity. General instructions  If the catheter site starts to bleed, raise your arm and put firm pressure on the site. If the bleeding does not stop, get help right away. This is a medical emergency.  DRINK PLENTY OF FLUIDS FOR THE NEXT 2-3 DAYS.  No alcohol consumption for 24 hours after receiving sedation.  If you went home on the same day as your procedure, a responsible adult should be with you for the first 24 hours after you arrive home.  Keep all follow-up visits as told by your health care provider. This is important. Contact a health care provider if:  You have a fever.  You have redness, swelling, or yellow drainage around your insertion site. Get help right away if:  You have unusual pain at the radial site.  The catheter insertion area swells very fast.  The insertion area is bleeding, and the bleeding does not stop when you hold steady pressure on the area.  Your arm or hand becomes pale, cool, tingly, or numb. These symptoms may represent a serious problem that is an emergency. Do not wait to see if the symptoms will go away. Get medical help right away. Call your local emergency services (911 in the [place] Do not drive yourself to the hospital. Summary  After the procedure, it is common to have bruising and tenderness at the site.  Follow instructions from your health care provider about how to take care   of your radial site wound. Check the wound every day for signs of infection.  This information is not intended to replace advice given to you by your health care provider. Make sure you discuss any questions you have with your health care provider. Document Revised: 11/17/2017 Document Reviewed: 11/17/2017 Elsevier Patient Education  2020 [employer] 

## 2020-12-10 NOTE — Progress Notes (Signed)
Harriett Meulyndyke,RN and Lindsey,NP here to assess pt. Pt still in tears with stabbing shooting pain shooting into lower(R)  arm and hand.

## 2020-12-10 NOTE — Progress Notes (Signed)
    Called to evaluate TR band/cath site. Patient reports sharp, nerve like pain proximal to TR band which extends about 2-3 inches as above pain. Hand was initially dusky but per RN improved after began deflating band. No hematoma noted at site/forearm. Will order some pain meds and RN to reassess as air removed from TR band.

## 2020-12-10 NOTE — Interval H&P Note (Signed)
Cath Lab Visit (complete for each Cath Lab visit)  Clinical Evaluation Leading to the Procedure:   ACS: No.  Non-ACS:    Anginal Classification: CCS III  Anti-ischemic medical therapy: Minimal Therapy (1 class of medications)  Non-Invasive Test Results: No non-invasive testing performed  Prior CABG: No previous CABG      History and Physical Interval Note:  12/10/2020 12:40 PM  Sarah Rocha  has presented today for surgery, with the diagnosis of angina.  The various methods of treatment have been discussed with the patient and family. After consideration of risks, benefits and other options for treatment, the patient has consented to  Procedure(s): LEFT HEART CATH AND CORONARY ANGIOGRAPHY (N/A) as a surgical intervention.  The patient's history has been reviewed, patient examined, no change in status, stable for surgery.  I have reviewed the patient's chart and labs.  Questions were answered to the patient's satisfaction.     Nicki Guadalajara

## 2020-12-10 NOTE — Progress Notes (Signed)
Pt waiting for Dr Tresa Endo to come and reassess.  Crying now and says pain is the same or worse than before.  Dr Tresa Endo notified.  Site remains unremarkable without bleeding hematoma.  Pt reports  that it feels just like the nerve pain in her legs. Like shooting electricity.

## 2020-12-11 ENCOUNTER — Encounter (HOSPITAL_COMMUNITY): Payer: Self-pay | Admitting: Cardiovascular Disease

## 2020-12-13 ENCOUNTER — Telehealth: Payer: Self-pay | Admitting: Cardiology

## 2020-12-13 DIAGNOSIS — M79601 Pain in right arm: Secondary | ICD-10-CM

## 2020-12-13 NOTE — Telephone Encounter (Signed)
Spoke with patient and advised her to go to the ED for treatment as we had not had any response from Dr. Tresa Endo or PA. Pt verbalized understanding and had no additional questions.

## 2020-12-13 NOTE — Telephone Encounter (Signed)
   Pt had a heart cath last 02/15 and the nurse told her during procedure they hit a nerve and up until today she doesn't have sensation on her hand and still in pain. She said she doesn't know what to do and would like to speak with Dr. Kem Parkinson nurse

## 2020-12-13 NOTE — Telephone Encounter (Signed)
Pt called our office today saying that during her cath she was told a nerve was hit. Pt states that in recovery a PA came in an saw her to say thing should improve in 72 hours and it not you should call your cardiologist. The pt also states that she was told if she required medication stronger than Tylenol she would need to stay in the hospital. Pt states that the pain is not controlled by Tylenol and that she does not have use of her hand. How do you advise?  Ladonna Snide, RN

## 2020-12-16 ENCOUNTER — Other Ambulatory Visit: Payer: Self-pay

## 2020-12-16 ENCOUNTER — Encounter: Payer: Self-pay | Admitting: Cardiovascular Disease

## 2020-12-16 ENCOUNTER — Encounter (HOSPITAL_COMMUNITY)
Admission: RE | Disposition: A | Discharge status: Home / Self Care | Payer: Self-pay | Source: Ambulatory Visit | Attending: Vascular Surgery

## 2020-12-16 ENCOUNTER — Inpatient Hospital Stay (HOSPITAL_COMMUNITY): Payer: 59 | Admitting: Anesthesiology

## 2020-12-16 ENCOUNTER — Ambulatory Visit (INDEPENDENT_AMBULATORY_CARE_PROVIDER_SITE_OTHER): Payer: 59 | Admitting: Cardiovascular Disease

## 2020-12-16 ENCOUNTER — Observation Stay (HOSPITAL_COMMUNITY)
Admission: RE | Admit: 2020-12-16 | Discharge: 2020-12-17 | Disposition: A | Discharge status: Home / Self Care | Payer: 59 | Source: Ambulatory Visit | Attending: Vascular Surgery | Admitting: Vascular Surgery

## 2020-12-16 ENCOUNTER — Ambulatory Visit (HOSPITAL_BASED_OUTPATIENT_CLINIC_OR_DEPARTMENT_OTHER)
Admission: RE | Admit: 2020-12-16 | Discharge: 2020-12-16 | Disposition: A | Discharge status: Home / Self Care | Payer: 59 | Source: Ambulatory Visit | Attending: Cardiovascular Disease | Admitting: Cardiovascular Disease

## 2020-12-16 ENCOUNTER — Encounter (HOSPITAL_COMMUNITY): Payer: Self-pay | Admitting: Vascular Surgery

## 2020-12-16 VITALS — BP 142/92 | HR 76 | Ht 65.0 in | Wt 249.8 lb

## 2020-12-16 DIAGNOSIS — X58XXXA Exposure to other specified factors, initial encounter: Secondary | ICD-10-CM | POA: Diagnosis not present

## 2020-12-16 DIAGNOSIS — R202 Paresthesia of skin: Secondary | ICD-10-CM | POA: Diagnosis not present

## 2020-12-16 DIAGNOSIS — Z20822 Contact with and (suspected) exposure to covid-19: Secondary | ICD-10-CM | POA: Insufficient documentation

## 2020-12-16 DIAGNOSIS — I70208 Unspecified atherosclerosis of native arteries of extremities, other extremity: Secondary | ICD-10-CM

## 2020-12-16 DIAGNOSIS — S55109A Unspecified injury of radial artery at forearm level, unspecified arm, initial encounter: Principal | ICD-10-CM | POA: Diagnosis present

## 2020-12-16 DIAGNOSIS — Z87891 Personal history of nicotine dependence: Secondary | ICD-10-CM | POA: Diagnosis not present

## 2020-12-16 DIAGNOSIS — M79601 Pain in right arm: Secondary | ICD-10-CM

## 2020-12-16 DIAGNOSIS — R0609 Other forms of dyspnea: Secondary | ICD-10-CM

## 2020-12-16 DIAGNOSIS — I742 Embolism and thrombosis of arteries of the upper extremities: Secondary | ICD-10-CM | POA: Diagnosis not present

## 2020-12-16 DIAGNOSIS — R0600 Dyspnea, unspecified: Secondary | ICD-10-CM

## 2020-12-16 HISTORY — PX: THROMBECTOMY AND REVISION OF ARTERIOVENTOUS (AV) GORETEX  GRAFT: SHX6120

## 2020-12-16 HISTORY — DX: Unspecified injury of radial artery at forearm level, unspecified arm, initial encounter: S55.109A

## 2020-12-16 HISTORY — DX: Other complications of anesthesia, initial encounter: T88.59XA

## 2020-12-16 HISTORY — PX: PATCH ANGIOPLASTY: SHX6230

## 2020-12-16 LAB — SARS CORONAVIRUS 2 BY RT PCR (HOSPITAL ORDER, PERFORMED IN ~~LOC~~ HOSPITAL LAB): SARS Coronavirus 2: NEGATIVE

## 2020-12-16 SURGERY — THROMBECTOMY AND REVISION OF ARTERIOVENTOUS (AV) GORETEX  GRAFT
Anesthesia: General | Site: Arm Lower | Laterality: Right

## 2020-12-16 MED ORDER — GABAPENTIN 400 MG PO CAPS
400.0000 mg | ORAL_CAPSULE | Freq: Every day | ORAL | Status: DC
  Administered 2020-12-16: 400 mg via ORAL
  Filled 2020-12-16: qty 1

## 2020-12-16 MED ORDER — GUAIFENESIN-DM 100-10 MG/5ML PO SYRP: 15.0000 mL | ORAL_SOLUTION | ORAL | Status: DC | PRN

## 2020-12-16 MED ORDER — ACETAMINOPHEN 325 MG PO TABS: 325.0000 mg | ORAL_TABLET | ORAL | Status: DC | PRN

## 2020-12-16 MED ORDER — CEFAZOLIN SODIUM-DEXTROSE 2-4 GM/100ML-% IV SOLN
2.0000 g | Freq: Once | INTRAVENOUS | Status: AC
  Administered 2020-12-16: 2 g via INTRAVENOUS

## 2020-12-16 MED ORDER — MORPHINE SULFATE (PF) 2 MG/ML IV SOLN: 2.0000 mg | INTRAVENOUS | Status: DC | PRN

## 2020-12-16 MED ORDER — DEXAMETHASONE SODIUM PHOSPHATE 10 MG/ML IJ SOLN
INTRAMUSCULAR | Status: DC | PRN
  Administered 2020-12-16: 10 mg via INTRAVENOUS

## 2020-12-16 MED ORDER — ASPIRIN EC 81 MG PO TBEC
81.0000 mg | DELAYED_RELEASE_TABLET | Freq: Every day | ORAL | Status: DC
  Administered 2020-12-16 – 2020-12-17 (×2): 81 mg via ORAL
  Filled 2020-12-16 (×2): qty 1

## 2020-12-16 MED ORDER — ACETAMINOPHEN 650 MG RE SUPP: 325.0000 mg | RECTAL | Status: DC | PRN

## 2020-12-16 MED ORDER — LABETALOL HCL 5 MG/ML IV SOLN: 10.0000 mg | INTRAVENOUS | Status: DC | PRN

## 2020-12-16 MED ORDER — ONDANSETRON HCL 4 MG/2ML IJ SOLN: 4.0000 mg | Freq: Four times a day (QID) | INTRAMUSCULAR | Status: DC | PRN

## 2020-12-16 MED ORDER — ALBUTEROL SULFATE HFA 108 (90 BASE) MCG/ACT IN AERS
2.0000 | INHALATION_SPRAY | Freq: Four times a day (QID) | RESPIRATORY_TRACT | Status: DC | PRN
  Filled 2020-12-16: qty 6.7

## 2020-12-16 MED ORDER — 0.9 % SODIUM CHLORIDE (POUR BTL) OPTIME
TOPICAL | Status: DC | PRN
  Administered 2020-12-16: 1000 mL

## 2020-12-16 MED ORDER — PROTAMINE SULFATE 10 MG/ML IV SOLN
INTRAVENOUS | Status: DC | PRN
  Administered 2020-12-16 (×2): 20 mg via INTRAVENOUS
  Administered 2020-12-16: 10 mg via INTRAVENOUS

## 2020-12-16 MED ORDER — SODIUM CHLORIDE 0.9 % IV SOLN: 500.0000 mL | Freq: Once | INTRAVENOUS | Status: DC | PRN

## 2020-12-16 MED ORDER — CEFAZOLIN SODIUM-DEXTROSE 2-4 GM/100ML-% IV SOLN
2.0000 g | Freq: Three times a day (TID) | INTRAVENOUS | Status: AC
  Administered 2020-12-17 (×2): 2 g via INTRAVENOUS
  Filled 2020-12-16 (×2): qty 100

## 2020-12-16 MED ORDER — ALBUMIN HUMAN 5 % IV SOLN: INTRAVENOUS | Status: DC | PRN

## 2020-12-16 MED ORDER — PANTOPRAZOLE SODIUM 40 MG PO TBEC
40.0000 mg | DELAYED_RELEASE_TABLET | Freq: Every day | ORAL | Status: DC
  Administered 2020-12-16 – 2020-12-17 (×2): 40 mg via ORAL
  Filled 2020-12-16 (×2): qty 1

## 2020-12-16 MED ORDER — SODIUM CHLORIDE 0.9 % IV SOLN: INTRAVENOUS | Status: DC | PRN

## 2020-12-16 MED ORDER — ONDANSETRON HCL 4 MG/2ML IJ SOLN
INTRAMUSCULAR | Status: DC | PRN
  Administered 2020-12-16: 4 mg via INTRAVENOUS

## 2020-12-16 MED ORDER — CHLORHEXIDINE GLUCONATE 0.12 % MT SOLN
OROMUCOSAL | Status: AC
  Administered 2020-12-16: 15 mL via OROMUCOSAL
  Filled 2020-12-16: qty 15

## 2020-12-16 MED ORDER — OXYCODONE HCL 5 MG/5ML PO SOLN: 5.0000 mg | Freq: Once | ORAL | Status: DC | PRN

## 2020-12-16 MED ORDER — HYDRALAZINE HCL 20 MG/ML IJ SOLN
5.0000 mg | INTRAMUSCULAR | Status: DC | PRN
Start: 2020-12-16 — End: 2020-12-17

## 2020-12-16 MED ORDER — HEPARIN SODIUM (PORCINE) 1000 UNIT/ML IJ SOLN
INTRAMUSCULAR | Status: DC | PRN
  Administered 2020-12-16: 12000 [IU] via INTRAVENOUS

## 2020-12-16 MED ORDER — SUCCINYLCHOLINE CHLORIDE 200 MG/10ML IV SOSY
PREFILLED_SYRINGE | INTRAVENOUS | Status: DC | PRN
  Administered 2020-12-16: 120 mg via INTRAVENOUS

## 2020-12-16 MED ORDER — OXYCODONE-ACETAMINOPHEN 5-325 MG PO TABS
1.0000 | ORAL_TABLET | ORAL | Status: DC | PRN
  Administered 2020-12-16 – 2020-12-17 (×4): 2 via ORAL
  Filled 2020-12-16 (×5): qty 2

## 2020-12-16 MED ORDER — MIDAZOLAM HCL 2 MG/2ML IJ SOLN
INTRAMUSCULAR | Status: AC
  Filled 2020-12-16: qty 2

## 2020-12-16 MED ORDER — LIDOCAINE HCL (PF) 1 % IJ SOLN
INTRAMUSCULAR | Status: AC
  Filled 2020-12-16: qty 30

## 2020-12-16 MED ORDER — HYDROMORPHONE HCL 1 MG/ML IJ SOLN
INTRAMUSCULAR | Status: AC
  Filled 2020-12-16: qty 0.5

## 2020-12-16 MED ORDER — PROPOFOL 10 MG/ML IV BOLUS
INTRAVENOUS | Status: AC
  Filled 2020-12-16: qty 20

## 2020-12-16 MED ORDER — HYDROMORPHONE HCL 1 MG/ML IJ SOLN
INTRAMUSCULAR | Status: DC | PRN
Start: 2020-12-16 — End: 2020-12-16
  Administered 2020-12-16: .5 mg via INTRAVENOUS

## 2020-12-16 MED ORDER — NITROGLYCERIN 0.4 MG SL SUBL: 0.4000 mg | SUBLINGUAL_TABLET | SUBLINGUAL | Status: DC | PRN

## 2020-12-16 MED ORDER — LIDOCAINE 2% (20 MG/ML) 5 ML SYRINGE
INTRAMUSCULAR | Status: DC | PRN
  Administered 2020-12-16: 60 mg via INTRAVENOUS

## 2020-12-16 MED ORDER — SODIUM CHLORIDE 0.9 % IV SOLN: INTRAVENOUS | Status: DC

## 2020-12-16 MED ORDER — LACTATED RINGERS IV SOLN: INTRAVENOUS | Status: DC | PRN

## 2020-12-16 MED ORDER — CHLORHEXIDINE GLUCONATE 0.12 % MT SOLN
15.0000 mL | Freq: Once | OROMUCOSAL | Status: AC
  Filled 2020-12-16: qty 15

## 2020-12-16 MED ORDER — FENTANYL CITRATE (PF) 100 MCG/2ML IJ SOLN: 25.0000 ug | INTRAMUSCULAR | Status: DC | PRN

## 2020-12-16 MED ORDER — SODIUM CHLORIDE 0.9 % IV SOLN
INTRAVENOUS | Status: AC
  Filled 2020-12-16: qty 1.2

## 2020-12-16 MED ORDER — DIPHENHYDRAMINE HCL 50 MG/ML IJ SOLN
INTRAMUSCULAR | Status: DC | PRN
  Administered 2020-12-16: 12.5 mg via INTRAVENOUS

## 2020-12-16 MED ORDER — FENTANYL CITRATE (PF) 250 MCG/5ML IJ SOLN
INTRAMUSCULAR | Status: AC
  Filled 2020-12-16: qty 5

## 2020-12-16 MED ORDER — METOPROLOL TARTRATE 5 MG/5ML IV SOLN
2.0000 mg | INTRAVENOUS | Status: DC | PRN
Start: 2020-12-16 — End: 2020-12-17

## 2020-12-16 MED ORDER — PHENYLEPHRINE HCL-NACL 10-0.9 MG/250ML-% IV SOLN
INTRAVENOUS | Status: DC | PRN
  Administered 2020-12-16: 15 ug/min via INTRAVENOUS

## 2020-12-16 MED ORDER — FENTANYL CITRATE (PF) 250 MCG/5ML IJ SOLN
INTRAMUSCULAR | Status: DC | PRN
  Administered 2020-12-16: 150 ug via INTRAVENOUS
  Administered 2020-12-16 (×2): 50 ug via INTRAVENOUS

## 2020-12-16 MED ORDER — PHENYLEPHRINE 40 MCG/ML (10ML) SYRINGE FOR IV PUSH (FOR BLOOD PRESSURE SUPPORT)
PREFILLED_SYRINGE | INTRAVENOUS | Status: DC | PRN
  Administered 2020-12-16: 120 ug via INTRAVENOUS
  Administered 2020-12-16 (×5): 80 ug via INTRAVENOUS

## 2020-12-16 MED ORDER — CEFAZOLIN SODIUM-DEXTROSE 2-4 GM/100ML-% IV SOLN
INTRAVENOUS | Status: AC
  Filled 2020-12-16: qty 100

## 2020-12-16 MED ORDER — PROPOFOL 10 MG/ML IV BOLUS
INTRAVENOUS | Status: DC | PRN
  Administered 2020-12-16: 200 mg via INTRAVENOUS
  Administered 2020-12-16: 40 mg via INTRAVENOUS

## 2020-12-16 MED ORDER — SUGAMMADEX SODIUM 200 MG/2ML IV SOLN
INTRAVENOUS | Status: DC | PRN
  Administered 2020-12-16: 250 mg via INTRAVENOUS

## 2020-12-16 MED ORDER — PHENOL 1.4 % MT LIQD: 1.0000 | OROMUCOSAL | Status: DC | PRN

## 2020-12-16 MED ORDER — MIDAZOLAM HCL 2 MG/2ML IJ SOLN
INTRAMUSCULAR | Status: DC | PRN
  Administered 2020-12-16: 2 mg via INTRAVENOUS

## 2020-12-16 MED ORDER — OXYCODONE HCL 5 MG PO TABS: 5.0000 mg | ORAL_TABLET | Freq: Once | ORAL | Status: DC | PRN

## 2020-12-16 MED ORDER — ROCURONIUM BROMIDE 10 MG/ML (PF) SYRINGE
PREFILLED_SYRINGE | INTRAVENOUS | Status: DC | PRN
  Administered 2020-12-16: 50 mg via INTRAVENOUS
  Administered 2020-12-16: 10 mg via INTRAVENOUS
  Administered 2020-12-16: 20 mg via INTRAVENOUS

## 2020-12-16 SURGICAL SUPPLY — 41 items
ADH SKN CLS APL DERMABOND .7 (GAUZE/BANDAGES/DRESSINGS) ×1
AGENT HMST SPONGE THK3/8 (HEMOSTASIS)
ARMBAND PINK RESTRICT EXTREMIT (MISCELLANEOUS) ×2 IMPLANT
CANISTER SUCT 3000ML PPV (MISCELLANEOUS) ×2 IMPLANT
CANNULA VESSEL 3MM 2 BLNT TIP (CANNULA) ×2 IMPLANT
CATH EMB 3FR 80CM (CATHETERS) ×2 IMPLANT
CATH EMB 4FR 80CM (CATHETERS) ×4 IMPLANT
CATH EMB 5FR 80CM (CATHETERS) IMPLANT
CLIP VESOCCLUDE MED 6/CT (CLIP) ×2 IMPLANT
CLIP VESOCCLUDE SM WIDE 6/CT (CLIP) ×2 IMPLANT
COVER WAND RF STERILE (DRAPES) IMPLANT
DECANTER SPIKE VIAL GLASS SM (MISCELLANEOUS) ×2 IMPLANT
DERMABOND ADVANCED (GAUZE/BANDAGES/DRESSINGS) ×1
DERMABOND ADVANCED .7 DNX12 (GAUZE/BANDAGES/DRESSINGS) ×1 IMPLANT
DRAPE X-RAY CASS 24X20 (DRAPES) IMPLANT
ELECT REM PT RETURN 9FT ADLT (ELECTROSURGICAL) ×2
ELECTRODE REM PT RTRN 9FT ADLT (ELECTROSURGICAL) ×1 IMPLANT
GLOVE BIO SURGEON STRL SZ7.5 (GLOVE) ×2 IMPLANT
GOWN STRL REUS W/ TWL LRG LVL3 (GOWN DISPOSABLE) ×3 IMPLANT
GOWN STRL REUS W/TWL LRG LVL3 (GOWN DISPOSABLE) ×6
HEMOSTAT SPONGE AVITENE ULTRA (HEMOSTASIS) IMPLANT
KIT BASIN OR (CUSTOM PROCEDURE TRAY) ×2 IMPLANT
KIT TURNOVER KIT B (KITS) ×2 IMPLANT
LOOP VESSEL MINI RED (MISCELLANEOUS) IMPLANT
NS IRRIG 1000ML POUR BTL (IV SOLUTION) ×2 IMPLANT
PACK CV ACCESS (CUSTOM PROCEDURE TRAY) ×2 IMPLANT
PAD ARMBOARD 7.5X6 YLW CONV (MISCELLANEOUS) ×4 IMPLANT
PATCH VASC XENOSURE 1CMX6CM (Vascular Products) ×2 IMPLANT
PATCH VASC XENOSURE 1X6 (Vascular Products) ×1 IMPLANT
SET COLLECT BLD 21X3/4 12 (NEEDLE) IMPLANT
STOPCOCK 4 WAY LG BORE MALE ST (IV SETS) IMPLANT
SUT PROLENE 6 0 CC (SUTURE) ×4 IMPLANT
SUT PROLENE 7 0 BV 1 (SUTURE) ×4 IMPLANT
SUT VIC AB 3-0 SH 27 (SUTURE) ×2
SUT VIC AB 3-0 SH 27X BRD (SUTURE) ×1 IMPLANT
SUT VICRYL 4-0 PS2 18IN ABS (SUTURE) ×2 IMPLANT
SYR 3ML LL SCALE MARK (SYRINGE) ×4 IMPLANT
TOWEL GREEN STERILE (TOWEL DISPOSABLE) ×2 IMPLANT
TUBING EXTENTION W/L.L. (IV SETS) IMPLANT
UNDERPAD 30X36 HEAVY ABSORB (UNDERPADS AND DIAPERS) ×2 IMPLANT
WATER STERILE IRR 1000ML POUR (IV SOLUTION) ×2 IMPLANT

## 2020-12-16 NOTE — Telephone Encounter (Signed)
Spoke with pt who states that the pain has not improved and she continues to have no use of her hand. A page is out for Theodore Demark, PA regarding same. Pt states that she contacted Dr. Landry Dyke office but has had no response.

## 2020-12-16 NOTE — Op Note (Signed)
Procedure: Thrombectomy right radial artery, patch angioplasty right radial artery (bovine pericardial)  Preoperative diagnosis: Ischemia right hand  Postoperative diagnosis: Same  Anesthesia: General  Assistant: Aggie Moats, PA-C for expediting procedure exposure and creation of anastomosis.  Operative findings: Thrombosed radial artery  Bovine pericardial patch extending from 2 cm proximal to the wrist to 6 cm proximal to the wrist  Operative details: After team informed consent, the patient taken the operating.  The patient was placed supine position operating table.  After induction general anesthesia patient's right upper extremities prepped and draped in usual sterile fashion.  Longitudinal incision was made incorporating the previous arterial puncture site near the right wrist.  The incision was carried down through the subtendinous tissues down the level the right radial artery.  There was some fibrinous adhesions to the radial artery but no active ongoing bleeding and no hematoma.  Upon exposing the radial artery was obviously thrombosed.  There was some retrograde pulse in the more distal segment of the radial artery presumably from the ulnar artery.  I proceeded to dissect out the artery circumferentially over several centimeters.  I could not find the exact needle insertion site.  This was even after exposing the artery over several centimeters.  At this point the patient was given 12,000 inch of intravenous heparin.  After 2 minutes of circulation time the artery was controlled proximally distally with fine bulldog clamps.  Longitudinal opening was made in the radial artery.  The radial artery was about 2-1/2 mm in diameter.  There was thrombus within the lumen.  Some of this was removed under direct vision.  I then proceeded to pass a #3 Fogarty catheter distally down the radial artery and retrieved some thrombus and after 2 passes had good backbleeding from the distal radial artery.  I  then proceeded to pass the Fogarty catheter proximally.  After removing a large amount of thrombus there was bleeding more proximally.  I exposed the radial artery another 2 cm and found the needle entry hole which was actively bleeding.  I was able to extend the arteriotomy up through this needle insertion site and just passed it to inspect the artery.  There was no significant intimal damage.  The artery was dissected free just above this and again controlled.  I then proceeded the past the #3 Fogarty catheter multiple times to all thrombotic material was removed and there was excellent arterial inflow.  2 clean passes were obtained.  I then brought a bovine pericardial patch upon the operative field and sewed a patch angioplasty onto the radial artery using a running 6-0 Prolene suture.  I tried to dilate the artery out thinking it may have been in spasm and I was able to get a 2-1/2 mm dilator into the artery.  However a 3 dilator was quite snug.  I was unable to pass this.  Just prior to completion of the bovine pericardial patch everything was for blood backbled and thoroughly flushed.  Anastomosis was secured clamps released there was good pulsatile flow antegrade and retrograde in the artery with Doppler.  There was also a palpable radial pulse.  2 repair stitches were placed.  The patient was given 50 mg of protamine.  The subcutaneous tissues were then closed with a running 3-0 Vicryl suture.  The skin was closed with a 4-0 Vicryl subcuticular stitch.  Dermabond was applied.  The patient tolerated procedure well and there were no complications.  The instrument sponge needle counts correct in the  case.  Patient was taken the recovery room in stable condition.  Patient had a palpable radial pulse at the wrist at the conclusion of the case.  Fabienne Bruns, MD [facility] Office: [phone]

## 2020-12-16 NOTE — H&P (Addendum)
Referring Physician: Dr Tresa Endo  Patient name: Sarah Rocha MRN: [id number] DOB: 05/25/1976 Sex: female  REASON FOR CONSULT: right hand numbness post cath  HPI: Sarah Rocha is a 45 y.o. female, s/p cath by radial artery right side 6 days ago.  Pt had some electrical type pains in forearm day of cath. Since she went home she has had numbness of the entire right hand and weakness.  She also has pain in the right forearm.  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  Her cath showed no coronary obstruction and normal EF. Other medical problems include peripheral neuropathy for which she takes gabapentin.  Past Medical History:  Diagnosis Date  . Abdominal hernia   . Abnormal uterine and vaginal bleeding, unspecified   . Acute bronchitis, unspecified   . Chest pain on breathing   . Lightheadedness   . Morbid obesity (HCC)   . Neuropathy   . Shortness of breath   . Syncope and collapse   . Vitamin D deficiency    Past Surgical History:  Procedure Laterality Date  . APPENDECTOMY    . CESAREAN SECTION    . CHOLECYSTECTOMY    . gastric bypass sleeve    . HERNIA REPAIR    . LEFT HEART CATH AND CORONARY ANGIOGRAPHY N/A 12/10/2020   Procedure: LEFT HEART CATH AND CORONARY ANGIOGRAPHY;  Surgeon: Lennette Bihari, MD;  Location: [facility];  Service: Cardiovascular;  Laterality: N/A;  . SKIN TAG REMOVAL    . TONSILLECTOMY      Family History  Problem Relation Age of Onset  . Diabetes Mother   . Heart attack Mother   . Diabetes Father   . Heart attack Father   . Hypertension Father     SOCIAL HISTORY: Social History   Socioeconomic History  . Marital status: Single    Spouse name: Not on file  . Number of children: Not on file  . Years of education: Not on file  . Highest education level: Not on file  Occupational History  . Not on file  Tobacco Use  . Smoking status: Former Games developer  . Smokeless tobacco: Never Used   Substance and Sexual Activity  . Alcohol use: Not on file  . Drug use: Not on file  . Sexual activity: Not on file  Other Topics Concern  . Not on file  Social History Narrative  . Not on file   Social Determinants of Health   Financial Resource Strain: Not on file  Food Insecurity: Not on file  Transportation Needs: Not on file  Physical Activity: Not on file  Stress: Not on file  Social Connections: Not on file  Intimate Partner Violence: Not on file    Allergies  Allergen Reactions  . Coconut Flavor Anaphylaxis and Swelling  . Pineapple Anaphylaxis and Swelling    No current facility-administered medications for this encounter.    ROS:   General:  No weight loss, Fever, chills  HEENT: No recent headaches, no nasal bleeding, no visual changes, no sore throat  Neurologic: No dizziness, blackouts, seizures. No recent symptoms of stroke or mini- stroke. No recent episodes of slurred speech, or temporary blindness.  Cardiac: No recent episodes of chest pain/pressure, no shortness of breath at rest.  No shortness of breath with exertion.  Denies history of atrial fibrillation or irregular heartbeat  Vascular: No history of rest pain in feet.  No history of  claudication.  No history of non-healing ulcer, No history of DVT   Pulmonary: No home oxygen, no productive cough, no hemoptysis,  No asthma or wheezing  Musculoskeletal:  [ ]  Arthritis, [ ]  Low back pain,  [ ]  Joint pain  Hematologic:No history of hypercoagulable state.  No history of easy bleeding.  No history of anemia  Gastrointestinal: No hematochezia or melena,  No gastroesophageal reflux, no trouble swallowing  Urinary: [ ]  chronic Kidney disease, [ ]  on HD - [ ]  MWF or [ ]  TTHS, [ ]  Burning with urination, [ ]  Frequent urination, [ ]  Difficulty urinating;   Skin: No rashes  Psychological: No history of anxiety,  No history of depression   Physical Examination  There were no vitals filed for this  visit.  There is no height or weight on file to calculate BMI.  General:  Alert and oriented, no acute distress HEENT: Normal Neck: No JVD Cardiac: Regular Rate and Rhythm Skin: No rash, no real ecchymosis or hematoma right forearm, cath entry site 1 cm length healing approximately 4 cm proximal to the wrist crease Extremity Pulses:  2+ radial, brachial ulnar left side, 2+ right brachial ulnar 1+ right radial hand is pink and fairly symmetric color to left and right Musculoskeletal: No deformity or edema, tender to palpation over course of radial artery right arm to mid forearm  Neurologic: right hand 3/5 motor flexion extension of  Wrist and use of right digits.  Left hand 5/5 motor.  Subjectively entire right hand is numb from the wrist down  DATA: Patient had a right upper extremity duplex exam today which shows the right radial artery is occluded from its mid segment all the way to the hand.  There is triphasic flow in the brachial artery and ulnar artery.   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  She also has some tenderness over the right forearm.  The compartments do not feel extremely tense but certainly she could have some blood product under the fascia making the pain increase in the right hand especially in light of the fact she has neurologic dysfunction.   PLAN: Exploration right radial artery and thrombectomy.  Risk benefits possible complications of procedure details discussed with the patient include but not limited to bleeding infection possibility that neuro function may not return completely.  I have also asked Dr. from hand surgery to evaluate her to see whether or not she may need decompressive release of fascia intraoperatively as well.  He will see the patient in the holding.  This is an emergency case patient was n.p.o. at 2 PM but we will proceed emergently.  We will also not wait on the result  of her Covid test.  , MD [facility] Office: [phone]  Addendum: Patient was seen and evaluated by Dr. from hand surgery in the preoperative holding area.  He agreed as well that the patient had a neurologic deficit but did not have signs or symptoms of compartment syndrome or tense compartments suggestive that she would need fasciotomy.  We then proceeded with the plan to go to the operating room for thrombectomy of her radial artery.  , MD [facility] [place] Office: [phone]

## 2020-12-16 NOTE — Progress Notes (Signed)
Dr. Chaney Malling made aware that patient unable to provide urine specimen. Per Dr. Chaney Malling no labs needed.

## 2020-12-16 NOTE — Patient Instructions (Signed)
GO TO SHORT STAY AND HAVE THEM PAGE DR CHARLES FIELDS, DR Tresa Endo HAS SPOKEN TO HIM AND KNOWS YOU ARE COMING

## 2020-12-16 NOTE — Anesthesia Preprocedure Evaluation (Signed)
Anesthesia Evaluation  Patient identified by MRN, date of birth, ID band Patient awake    Reviewed: Allergy & Precautions, H&P , NPO status , Patient's Chart, lab work & pertinent test results  Airway Mallampati: II   Neck ROM: full    Dental   Pulmonary former smoker,    breath sounds clear to auscultation       Cardiovascular negative cardio ROS   Rhythm:regular Rate:Normal     Neuro/Psych    GI/Hepatic   Endo/Other  Morbid obesity  Renal/GU      Musculoskeletal   Abdominal   Peds  Hematology   Anesthesia Other Findings   Reproductive/Obstetrics                             Anesthesia Physical Anesthesia Plan  ASA: II and emergent  Anesthesia Plan: General   Post-op Pain Management:    Induction: Intravenous and Rapid sequence  PONV Risk Score and Plan: 3 and Ondansetron, Dexamethasone, Midazolam and Treatment may vary due to age or medical condition  Airway Management Planned: Oral ETT  Additional Equipment:   Intra-op Plan:   Post-operative Plan: Extubation in OR  Informed Consent: I have reviewed the patients History and Physical, chart, labs and discussed the procedure including the risks, benefits and alternatives for the proposed anesthesia with the patient or authorized representative who has indicated his/her understanding and acceptance.     Dental advisory given  Plan Discussed with: CRNA, Anesthesiologist and Surgeon  Anesthesia Plan Comments:         Anesthesia Quick Evaluation

## 2020-12-16 NOTE — Progress Notes (Signed)
Patient seen on 4 E. postoperatively.  She has a palpable radial pulse.  She still has some tenderness over the medial portion of the right forearm but it is improved.  She also still having some difficulty moving the hand and some numbness and tingling in the hand but may be slightly improved as well.  Patient's incision is clean no evidence of hematoma.  Discussed with patient that we will need to do physical therapy starting tomorrow.  Possible discharge tomorrow morning if no evidence of recurrent ischemia and continued improvement of right hand.  Discussed with patient and her husband that she may not completely recover all neurologic function but that hopefully with revascularization this would continue to improve with time.  Fabienne Bruns, MD [facility] [place] Office: [phone]

## 2020-12-16 NOTE — Transfer of Care (Signed)
Immediate Anesthesia Transfer of Care Note  Patient: Sarah Rocha  Procedure(s) Performed: EXPLORATION RIGHT RADIAL ARTEY THROMBECTOMY (Right Arm Lower) PATCH ANGIOPLASTY RIGHT RADIAL ARTERY USING XENOSURE BIOLOGIC PATCH (Right Arm Lower)  Patient Location: PACU  Anesthesia Type:General  Level of Consciousness: awake, alert  and oriented  Airway & Oxygen Therapy: Patient Spontanous Breathing and Patient connected to nasal cannula oxygen  Post-op Assessment: Report given to RN and Post -op Vital signs reviewed and stable  Post vital signs: Reviewed and stable  Last Vitals:  Vitals Value Taken Time  BP 136/83 12/16/20 2012  Temp 36.1 C 12/16/20 2012  Pulse 97 12/16/20 2014  Resp 26 12/16/20 2014  SpO2 100 % 12/16/20 2014  Vitals shown include unvalidated device data.  Last Pain:  Vitals:   12/16/20 1709  TempSrc: Oral  PainSc: 8          Complications: No complications documented.

## 2020-12-16 NOTE — Consult Note (Signed)
Reason for Consult:ischemia R hand, ? Pending ocmpartment syndrome Referring Physician: Vascular Surgery Dr. Darrick Penna  CC:My forearm and wrist hurt, they are numb  HPI:  Sarah Rocha is an 45 y.o. rihgt handed female who presents with ischemic symptoms of the right hand.  Right hand and forearm are painful with some motion, and pt has decreased sensation of the entire hand.  Pt had cath thru radial artery on 2/15, had severe pain after cath which has not improved completely, has numbness - recently diagnosed with radial artery thrombosis.  She is here for radial artery exploration by Dr. Darrick Penna.   Pain is rated at   9 /10 and is described as sharp.  Pain is constant.  Pain is made better by rest/immobilization, worse with motion.   Associated signs/symptoms: numbness in hand, cool to touch, pain with wrist flex/extend/ elbow fully extended, finger extension Previous treatment:  Radial artery cath  Past Medical History:  Diagnosis Date  . Abdominal hernia   . Abnormal uterine and vaginal bleeding, unspecified   . Acute bronchitis, unspecified   . Chest pain on breathing   . Complication of anesthesia    hard to wake  . Lightheadedness   . Morbid obesity (HCC)   . Neuropathy   . Shortness of breath   . Syncope and collapse   . Vitamin D deficiency     Past Surgical History:  Procedure Laterality Date  . APPENDECTOMY    . CESAREAN SECTION    . CHOLECYSTECTOMY    . gastric bypass sleeve    . HERNIA REPAIR    . LEFT HEART CATH AND CORONARY ANGIOGRAPHY N/A 12/10/2020   Procedure: LEFT HEART CATH AND CORONARY ANGIOGRAPHY;  Surgeon: Lennette Bihari, MD;  Location: [facility] INV[facility] LAB;  Service: Cardiovascular;  Laterality: N/A;  . SKIN TAG REMOVAL    . TONSILLECTOMY      Family History  Problem Relation Age of Onset  . Diabetes Mother   . Heart attack Mother   . Diabetes Father   . Heart attack Father   . Hypertension Father     Social History:  reports that she has quit  smoking. She has never used smokeless tobacco. She reports that she does not drink alcohol and does not use drugs.  Allergies:  Allergies  Allergen Reactions  . Coconut Flavor Anaphylaxis and Swelling  . Pineapple Anaphylaxis and Swelling    Medications: I have reviewed the patient's current medications.  Results for orders placed or performed during the hospital encounter of 12/16/20 (from the past 48 hour(s))  SARS Coronavirus 2 by RT PCR (hospital order, performed in [facility] hospital lab) Nasopharyngeal Nasopharyngeal Swab     Status: None   Collection Time: 12/16/20  4:53 PM   Specimen: Nasopharyngeal Swab  Result Value Ref Range   SARS Coronavirus 2 NEGATIVE NEGATIVE    Comment: (NOTE) SARS-CoV-2 target nucleic acids are NOT DETECTED.  The SARS-CoV-2 RNA is generally detectable in upper and lower respiratory specimens during the acute phase of infection. The lowest concentration of SARS-CoV-2 viral copies this assay can detect is 250 copies / mL. A negative result does not preclude SARS-CoV-2 infection and should not be used as the sole basis for treatment or other patient management decisions.  A negative result may occur with improper specimen collection / handling, submission of specimen other than nasopharyngeal swab, presence of viral mutation(s) within the areas targeted by this assay, and inadequate number of viral copies (<250 copies /  mL). A negative result must be combined with clinical observations, patient history, and epidemiological information.  Fact Sheet for Patients:   BoilerBrush.com.cy  Fact Sheet for Healthcare Providers: https://pope.com/  This test is not yet approved or  cleared by the [place] FDA and has been authorized for detection and/or diagnosis of SARS-CoV-2 by FDA under an Emergency Use Authorization (EUA).  This EUA will remain in effect (meaning this test can be used) for the  duration of the COVID-19 declaration under Section 564(b)(1) of the Act, 21 U.S.C. section 360bbb-3(b)(1), unless the authorization is terminated or revoked sooner.  Performed at [facility], [address], [place], [place] [address]     VAS [place] UPPER EXTREMITY ARTERIAL DUPLEX  Result Date: 12/16/2020 UPPER EXTREMITY DUPLEX STUDY Indications: Patient complains of wrist pain, tingling in the right arm and hand              s/p right radial artery access for catheterization on 12/10/20. History:     Patient has a history of catheterization via right radial artery.  Risk Factors: Past history of smoking. Performing Technologist: Olegario Shearer RVT  Examination Guidelines: A complete evaluation includes B-mode imaging, spectral Doppler, color Doppler, and power Doppler as needed of all accessible portions of each vessel. Bilateral testing is considered an integral part of a complete examination. Limited examinations for reoccurring indications may be performed as noted.  Right Doppler Findings: +-------------+----------+----------+--------+---------+ Site         PSV (cm/s)Waveform  StenosisComments  +-------------+----------+----------+--------+---------+ Brachial Dist105       triphasic                   +-------------+----------+----------+--------+---------+ Radial Prox  28        monophasic        resistant +-------------+----------+----------+--------+---------+ Radial Mid   0                   occluded          +-------------+----------+----------+--------+---------+ Radial Dist  0                   occluded          +-------------+----------+----------+--------+---------+ Ulnar Prox   60        triphasic                   +-------------+----------+----------+--------+---------+ Ulnar Mid    76        triphasic                   +-------------+----------+----------+--------+---------+ Ulnar Dist   82        triphasic                    +-------------+----------+----------+--------+---------+    Summary:  Right: Obstruction noted in the radial artery. The radial artery        appears to occlude shortly after the origin. The distal        brachial and ulnar arteries are widely patent.  No evidence of pseudo, AVF or hematoma. *See table(s) above for measurements and observations.    Preliminary     Pertinent items are noted in HPI. Temp:  [96.9 F (36.1 C)-98.3 F (36.8 C)] 97.6 F (36.4 C) (02/21 2100) Pulse Rate:  [76-102] 90 (02/21 2100) Resp:  [17-19] 17 (02/21 2100) BP: (136-162)/(83-92) 145/89 (02/21 2100) SpO2:  [95 %-100 %] 100 % (02/21 2100) Weight:  [113.3 kg] 113.3 kg (02/21 1416)  General appearance: alert and cooperative Resp: clear to auscultation bilaterally Cardio: regular rate and rhythm Extremities: extremities normal, atraumatic, no cyanosis or edema Except for RUE: hand cooler than L, palpable ulnar pulse, weakly palpable digital pulses, delayed cap refill, rom is full but with discomfort in forearm with full extension of fingers and wrist, hand is minimally swollen, soft tissues soft in hand and forearm, evidence of radial artery puncture site with sl bruising, sensation is protective of all fingers and even dorsal hand Strength is decreased in fingers, wrist  Assessment: 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 Incomplete palmar arch  Plan: Vascular to proceed with artery exploration, I think once blood flow restored, this will make her symptoms get better, may take some time.  Will watch for reperfusion issues.   Johnette Abraham 12/16/2020, 9:25 PM

## 2020-12-16 NOTE — Discharge Instructions (Signed)
Incision Care, Adult An incision is a surgical cut that is made through your skin. Most incisions are closed after a surgical procedure. Your incision may be closed with stitches (sutures), staples, skin glue, or adhesive strips. You may need to return to your health care provider to have sutures or staples removed. This may occur several days or several weeks after your surgery. Until then, the incision needs to be cared for properly to prevent infection. Follow instructions from your health care provider about how to care for your incision. Supplies needed:  Soap, water, and a clean hand towel.  Wound cleanser.  A clean bandage (dressing), if needed.  Cream or ointment, if told by your health care provider.  Clean gauze. How to care for your incision Cleaning the incision Ask your health care provider how to clean the incision. This may include:  Using mild soap and water, or wound cleanser.  Using a clean gauze to pat the incision dry after cleaning it. Dressing changes  Wash your hands with soap and water for at least 20 seconds before and after you change the dressing. If soap and water are not available, use hand sanitizer.  Change your dressing as told by your health care provider.  Leave sutures, staples, skin glue, or adhesive strips in place. These skin closures may need to stay in place for 2 weeks or longer. If adhesive strip edges start to loosen and curl up, you may trim the loose edges. Do not remove adhesive strips completely unless your health care provider tells you to do that.  Apply cream or ointment. Do this only as told by your health care provider.  Cover the incision with a clean dressing. Ask your health care provider when you can begin leaving the incision uncovered. Checking for infection Check your incision area every day for signs of infection. Check for:  More redness, swelling, or pain.  More fluid or blood.  Warmth.  Pus or a bad smell.    Follow these instructions at home Medicines  Take over-the-counter and prescription medicines only as told by your health care provider.  If you were prescribed an antibiotic medicine, cream, or ointment, take or apply it as told by your health care provider. Do not stop using the antibiotic even if your condition improves. Eating and drinking  Eat a diet that includes protein, vitamin A, vitamin C, and other nutrient-rich foods to help the wound heal. ? Foods rich in protein include meat, fish, eggs, dairy, beans, and nuts. ? Foods rich in vitamin A include carrots and dark green, leafy vegetables. ? Foods rich in vitamin C include citrus fruits, tomatoes, broccoli, and peppers.  Drink enough fluid to keep your urine pale yellow. General instructions  Do not take baths, swim, use a hot tub, or do anything that would put the incision underwater until your health care provider approves. Ask your health care provider if you may take showers. You may only be allowed to take sponge baths.  Limit movement around your incision to promote healing. ? Avoid straining, lifting, or exercising for the first 2 weeks after your procedure, or for as long as told by your health care provider. ? Return to your normal activities as told by your health care provider. Ask your health care provider what activities are safe for you.  Do not scratch or pick at the incision. Keep it covered as told by your health care provider.  Protect your incision from the sun when you are   outside for the first 6 months, or for as long as told by your health care provider. Cover up the scar area or apply sunscreen that has an SPF of at least 30.  Do not use any products that contain nicotine or tobacco, such as cigarettes, e-cigarettes, and chewing tobacco. These can delay incision healing after surgery. If you need help quitting, ask your health care provider.  Keep all follow-up visits as told by your health care  provider. This is important.   Contact a health care provider if:  You have any of these signs of infection: ? More redness, swelling, or pain around your incision. ? More fluid or blood coming from your incision. ? Warmth coming from your incision. ? Pus or a bad smell coming from your incision. ? A fever.  You are nauseous or you vomit.  You are dizzy.  Your sutures, staples, skin glue, or adhesive strips come undone. Get help right away if:  You have a red streak on the skin near your incision.  Your incision bleeds through the dressing and the bleeding does not stop with gentle pressure.  The edges of your incision open up and separate.  You have signs of a serious bodily reaction to an infection. These signs may include: ? Fever, shaking chills, or feeling very cold. ? Confusion or anxiety. ? Severe pain. ? Trouble breathing. ? Fast heartbeat. ? Clammy or sweaty skin. ? A rash. These symptoms may represent a serious problem that is an emergency. Do not wait to see if the symptoms will go away. Get medical help right away. Call your local emergency services (911 in the [place] Do not drive yourself to the hospital. Summary  Follow instructions from your health care provider about how to care for your incision.  Wash your hands with soap and water for at least 20 seconds before and after you change the dressing. If soap and water are not available, use hand sanitizer.  Check your incision area every day for signs of infection.  Keep all follow-up visits as told by your health care provider. This is important. This information is not intended to replace advice given to you by your health care provider. Make sure you discuss any questions you have with your health care provider. Document Revised: 08/02/2019 Document Reviewed: 08/02/2019 Elsevier Patient Education  2021 [employer]vier Inc[employer]   

## 2020-12-16 NOTE — Telephone Encounter (Signed)
Dr Tresa Endo spoke with patient, he had not seen message until I discussed with him today Patient coming to office for arterial duplex and evaluation by Dr Tresa Endo

## 2020-12-16 NOTE — Anesthesia Procedure Notes (Signed)
Procedure Name: Intubation Date/Time: 12/16/2020 6:06 PM Performed by: Dorthea Cove, CRNA Pre-anesthesia Checklist: Patient identified, Emergency Drugs available, Suction available and Patient being monitored Patient Re-evaluated:Patient Re-evaluated prior to induction Oxygen Delivery Method: Circle system utilized Preoxygenation: Pre-oxygenation with 100% oxygen Induction Type: IV induction and Rapid sequence Laryngoscope Size: Mac and 3 Grade View: Grade I Tube type: Oral Tube size: 7.0 mm Number of attempts: 1 Airway Equipment and Method: Stylet and Oral airway Placement Confirmation: ETT inserted through vocal cords under direct vision,  positive ETCO2 and breath sounds checked- equal and bilateral Secured at: 21 cm Tube secured with: Tape Dental Injury: Teeth and Oropharynx as per pre-operative assessment

## 2020-12-16 NOTE — Anesthesia Postprocedure Evaluation (Signed)
Anesthesia Post Note  Patient: Sarah Rocha  Procedure(s) Performed: EXPLORATION RIGHT RADIAL ARTEY THROMBECTOMY (Right Arm Lower) PATCH ANGIOPLASTY RIGHT RADIAL ARTERY USING XENOSURE BIOLOGIC PATCH (Right Arm Lower)     Patient location during evaluation: PACU Anesthesia Type: General Level of consciousness: awake Pain management: pain level controlled Vital Signs Assessment: post-procedure vital signs reviewed and stable Respiratory status: spontaneous breathing, nonlabored ventilation, respiratory function stable and patient connected to nasal cannula oxygen Cardiovascular status: blood pressure returned to baseline and stable Postop Assessment: no apparent nausea or vomiting Anesthetic complications: no   No complications documented.  Last Vitals:  Vitals:   12/16/20 2012 12/16/20 2100  BP: 136/83 (!) 145/89  Pulse: (!) 102 90  Resp: 19 17  Temp: (!) 36.1 C 36.4 C  SpO2: 95% 100%    Last Pain:  Vitals:   12/16/20 2100  TempSrc: Oral  PainSc: 4                  Timon Geissinger P Arrington Bencomo

## 2020-12-16 NOTE — Progress Notes (Addendum)
Cardiology Office Note    Date:  12/16/2020   ID:  Sarah Rocha, DOB 07/22/1976, MRN [id number]  PCP:  Sarah Rocha, Sarah M, NP  Cardiologist:  Sarah Guadalajarahomas Dell Hurtubise, MD, Sarah Rocha  And- on evaluation for right forearm discomfort and right hand paresthesias following radial artery cardiac catheterization.  History of Present Illness:  Sarah Rocha is a 45 y.o. female who was referred by Sarah Rocha for diagnostic cardiac catheterization on December 10, 2020.  She had experienced some chest pain in the distant past and [place] heart [place].  She was told of having normal coronary arteries and possible cardiomyopathy.  2 years ago, she underwent gastric bypass surgery and lost approximately 100 pounds.  She had recently developed increasing shortness of breath with minimal activity and was seen by Sarah Rocha.  Definitive cardiac catheterization was recommended.   The patient underwent right radial axis cardiac catheterization by me on December 10, 2020.  Radial artery was deep, and ultimately access was obtained.  There was significant difficulty in passing the sheath beyond the skin into the radial artery but ultimately this was successful and passed freely.  Catheterization was done without incident and the catheters were passed freely.  She was found to have normal epicardial coronary arteries with a large dominant RCA.  She had normal LV function with an EF estimated at 60 to 65% and LVEDP was 15 mm.  Following the procedure, in short stay the patient complained of some arm discomfort.  She was evaluated by Sarah Demarkhonda Barrett, PA.  Her forearm was soft but she complained of nerve pain running up her arm from the cath site.  She was advised to keep her arm elevated.  I was involved with a patient who was seriously ill and then was called to the Cath Lab to perform the procedure which turned out to be a long case and required intervention.  As  result when it was time for the patient to be discharged I was  involved in this long case in the Cath Lab and was unable to to see her before discharge.  Since her catheterization, the patient has noticed right arm discomfort with swelling.  She also admits to pain to palpation to the forearm and has been able to move her fingers but has noticed paresthesias of her hand.  When I came to the office today I was informed of this information and personally called the patient and recommended she come to our office for my evaluation.  She underwent a upper extremity arterial Doppler study which suggested an occlusion of the mid distal radial artery.  Distal brachial and ulnar arteries were widely patent.  The patient admitted to still having discomfort in her forearm with some swelling which was not tense.  However she noted paresthesias to her hand.  Past Medical History:  Diagnosis Date   Abdominal hernia    Abnormal uterine and vaginal bleeding, unspecified    Acute bronchitis, unspecified    Chest pain on breathing    Complication of anesthesia    hard to wake   Lightheadedness    Morbid obesity (HCC)    Neuropathy    Shortness of breath    Syncope and collapse    Vitamin D deficiency     Past Surgical History:  Procedure Laterality Date   APPENDECTOMY     CESAREAN SECTION     CHOLECYSTECTOMY     gastric bypass sleeve  HERNIA REPAIR     LEFT HEART CATH AND CORONARY ANGIOGRAPHY N/A 12/10/2020   Procedure: LEFT HEART CATH AND CORONARY ANGIOGRAPHY;  Surgeon: Sarah Bihari, MD;  Location: [facility];  Service: Cardiovascular;  Laterality: N/A;   SKIN TAG REMOVAL     TONSILLECTOMY      Current Medications: No facility-administered medications prior to visit.   Outpatient Medications Prior to Visit  Medication Sig Dispense Refill   acetaminophen (TYLENOL) 325 MG tablet Take 650 mg by mouth every 6 (six) hours as needed.     albuterol (VENTOLIN HFA) 108  (90 Base) MCG/ACT inhaler Inhale 2 puffs into the lungs every 6 (six) hours as needed for wheezing.     aspirin EC 81 MG tablet Take 1 tablet (81 mg total) by mouth daily. Swallow whole. 90 tablet 3   gabapentin (NEURONTIN) 400 MG capsule Take 400 mg by mouth at bedtime.     nitroGLYCERIN (NITROSTAT) 0.4 MG SL tablet Place 1 tablet (0.4 mg total) under the tongue every 5 (five) minutes as needed. 25 tablet 6     Allergies:   Coconut flavor and Pineapple   Social History   Socioeconomic History   Marital status: Single    Spouse name: Not on file   Number of children: Not on file   Years of education: Not on file   Highest education level: Not on file  Occupational History   Not on file  Tobacco Use   Smoking status: Former Smoker   Smokeless tobacco: Never Used  Building services engineer Use: Never used  Substance and Sexual Activity   Alcohol use: Never   Drug use: Never   Sexual activity: Not on file  Other Topics Concern   Not on file  Social History Narrative   Not on file   Social Determinants of Health   Financial Resource Strain: Not on file  Food Insecurity: Not on file  Transportation Needs: Not on file  Physical Activity: Not on file  Stress: Not on file  Social Connections: Not on file     Family History:  The patient's family history includes Diabetes in her father and mother; Heart attack in her father and mother; Hypertension in her father.   ROS General: Negative; No fevers, chills, or night sweats;  HEENT: Negative; No changes in vision or hearing, sinus congestion, difficulty swallowing Pulmonary: Negative; No cough, wheezing, shortness of breath, hemoptysis Cardiovascular: Negative; No chest pain, presyncope, syncope, palpitations GI: Negative; No nausea, vomiting, diarrhea, or abdominal pain;  history of gastric bypass GU: Negative; No dysuria, hematuria, or difficulty voiding Musculoskeletal: See HPI Hematologic/Oncology: Negative; no  easy bruising, bleeding Endocrine: Negative; no heat/cold intolerance; no diabetes Neuro: Hand Skin: Negative; No rashes or skin lesions Psychiatric: Negative; No behavioral problems, depression Sleep: Negative; No snoring, daytime sleepiness, hypersomnolence, bruxism, restless legs, hypnogognic hallucinations, no cataplexy Other comprehensive 14 point system review is negative.   PHYSICAL EXAM:   VS:  BP (!) 142/92    Pulse 76    Ht  (1.651 Rocha)    Wt 249 lb 12.8 oz (113.3 kg)    SpO2 96%    BMI 41.57 kg/Rocha     Wt Readings from Last 3 Encounters:  12/16/20 249 lb 12.8 oz (113.3 kg)  12/10/20 250 lb (113.4 kg)  12/04/20 251 lb (113.9 kg)    General: Alert, oriented, no distress.  Skin: normal turgor, no rashes, warm and dry HEENT: Normocephalic, atraumatic. Pupils equal  round and reactive to light; sclera anicteric; extraocular muscles intact;  Nose without nasal septal hypertrophy Mouth/Parynx benign; Neck: No JVD, no carotid bruits; normal carotid upstroke Lungs: clear to ausculatation and percussion; no wheezing or rales Chest wall: without tenderness to palpitation Heart: PMI not displaced, RRR, s1 s2 normal, 1/6 systolic murmur, no diastolic murmur, no rubs, gallops, thrills, or heaves Abdomen: soft, nontender; no hepatosplenomehaly, BS+; abdominal aorta nontender and not dilated by palpation. Back: no CVA tenderness Pulses intact pulse nonpalpable right radial pulse at the access site.  2+ right ulnar pulse. Musculoskeletal: Tender to palpation over the course of the right radial artery extending from the wrist to below the elbow.  Mild swelling, not severely tense. Extremities: Right hand flexed upward with mild movement to fingers.  Skin color in the right hand similar to the left. Neurologic: As in numbness to the right hand. Psychologic: Normal mood but obviously in some pain particular with palpation of her forearm.   Studies/Labs Reviewed:   EKG:  EKG is no ordered  today.    Recent Labs: BMP Latest Ref Rng & Units 12/04/2020  Glucose 65 - 99 mg/dL 87  BUN 6 - 24 mg/dL 10  Creatinine 2.72 - 5.36 mg/dL 6.44  BUN/Creat Ratio 9 - 23 13  Sodium 134 - 144 mmol/L 141  Potassium 3.5 - 5.2 mmol/L 4.5  Chloride 96 - 106 mmol/L 104  CO2 20 - 29 mmol/L 23  Calcium 8.7 - 10.2 mg/dL 9.7     No flowsheet data found.  CBC Latest Ref Rng & Units 12/04/2020  WBC 3.4 - 10.8 x10E3/uL 8.7  Hemoglobin 11.1 - 15.9 g/dL 03.4  Hematocrit 74.2 - 46.6 % 42.8  Platelets 150 - 450 x10E3/uL 220   Lab Results  Component Value Date   MCV 86 12/04/2020   No results found for: TSH No results found for: HGBA1C   BNP No results found for: BNP  ProBNP No results found for: PROBNP   Lipid Panel  No results found for: CHOL, TRIG, HDL, CHOLHDL, VLDL, LDLCALC, LDLDIRECT, LABVLDL   RADIOLOGY: CARDIAC CATHETERIZATION  Result Date: 12/10/2020  The left ventricular systolic function is normal.  LV end diastolic pressure is normal.  Normal epicardial coronary arteries with a large dominant RCA vessel. Normal LV function with EF estimated at 60 to 65%.  LVEDP 15 mmHg. RECOMMENDATION: Suspect non cardiac chest pain.  Continue with aggressive weight loss and lifestyle management.  Patient will follow up with Dr. Tomie China.   VAS [place] UPPER EXTREMITY ARTERIAL DUPLEX  Result Date: 12/16/2020 UPPER EXTREMITY DUPLEX STUDY Indications: Patient complains of wrist pain, tingling in the right arm and hand              s/p right radial artery access for catheterization on 12/10/20. History:     Patient has a history of catheterization via right radial artery.  Risk Factors: Past history of smoking. Performing Technologist: Olegario Shearer RVT  Examination Guidelines: A complete evaluation includes B-mode imaging, spectral Doppler, color Doppler, and power Doppler as needed of all accessible portions of each vessel. Bilateral testing is considered an integral part of a complete examination.  Limited examinations for reoccurring indications may be performed as noted.  Right Doppler Findings: +-------------+----------+----------+--------+---------+  Site          PSV (cm/s) Waveform   Stenosis Comments   +-------------+----------+----------+--------+---------+  Brachial Dist 105        triphasic                      +-------------+----------+----------+--------+---------+  Radial Prox   28         monophasic          resistant  +-------------+----------+----------+--------+---------+  Radial Mid    0                     occluded            +-------------+----------+----------+--------+---------+  Radial Dist   0                     occluded            +-------------+----------+----------+--------+---------+  Ulnar Prox    60         triphasic                      +-------------+----------+----------+--------+---------+  Ulnar Mid     76         triphasic                      +-------------+----------+----------+--------+---------+  Ulnar Dist    82         triphasic                      +-------------+----------+----------+--------+---------+    Summary:  Right: Obstruction noted in the radial artery. The radial artery        appears to occlude shortly after the origin. The distal        brachial and ulnar arteries are widely patent.  No evidence of pseudo, AVF or hematoma. *See table(s) above for measurements and observations.    Preliminary      Additional studies/ records that were reviewed today include:  I reviewed the patient's authorization findings as noted above.  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 Paresthesias in right hand   3. Morbid obesity (HCC)   4. Exertional dyspnea     PLAN:  Ms. Annai Rocha is a very pleasant 45 year old female who remotely had experienced chest pain underwent cardiac catheterization and part approximately 15 years ago.  At the time she was told of having possible cardiomyopathy but had normal coronary arteries.  She has a  history of significant obesity and 2 years ago had undergone successful gastric bypass surgery with resultant 100 pound initial weight loss.  She had developed recent progressive shortness of breath and when recently seen by Dr. Tomie China cardiac catheterization was recommended.  She was found to have normal coronary arteries.  Right radial access was performed but it was very difficult to advance the sheath beyond the skin and in the initial soft tissue which ultimately was successful.  Subsequently, the patient has noticed some swelling to this area as well as pain.  In addition she has developed numbness to her right hand suggesting neuro dysfunction.  I had contacted Dr. Chestine Spore prior to her visit with me to discuss my concerns for compartment syndrome and notified him of the Doppler findings showing an occluded mid distal radial artery.  There was no evidence on the duplex scan of any hematoma, pseudoaneurysm, or AV fistula.  With her continuing to have significant pain and with her neurologic dysfunction I contacted Dr. Fabienne Bruns of [facility] who is on-call today.  I discussed my concerns for possible compartment syndrome and need for evaluation today.  He advised that I send the patient to [facility] and that he be contacted immediately upon  her arrival.  I discussed the importance of this evaluation with the patient who will leave directly from our office to go to [facility] for surgical evaluation and if necessary potential need for exploratory surgery for possible compartment syndrome.   Medication Adjustments/Labs and Tests Ordered: Current medicines are reviewed at length with the patient today.  Concerns regarding medicines are outlined above.  Medication changes, Labs and Tests ordered today are listed in the Patient Instructions below. Patient Instructions  GO TO SHORT STAY AND HAVE THEM PAGE DR Fabienne Bruns, DR Tresa Endo HAS SPOKEN TO HIM AND KNOWS YOU ARE COMING     Signed, Sarah Guadalajara,  MD  12/16/2020 6:08 PM    [facility] [address], [place], [place]  [address] Phone: [phone]

## 2020-12-16 NOTE — Addendum Note (Signed)
Addended by: Regis Bill B on: 12/16/2020 01:47 PM   Modules accepted: Orders

## 2020-12-17 ENCOUNTER — Encounter (HOSPITAL_COMMUNITY): Payer: Self-pay | Admitting: Vascular Surgery

## 2020-12-17 ENCOUNTER — Observation Stay (HOSPITAL_BASED_OUTPATIENT_CLINIC_OR_DEPARTMENT_OTHER): Payer: 59

## 2020-12-17 ENCOUNTER — Telehealth: Payer: Self-pay

## 2020-12-17 DIAGNOSIS — R0989 Other specified symptoms and signs involving the circulatory and respiratory systems: Secondary | ICD-10-CM

## 2020-12-17 DIAGNOSIS — S55109A Unspecified injury of radial artery at forearm level, unspecified arm, initial encounter: Secondary | ICD-10-CM | POA: Diagnosis not present

## 2020-12-17 LAB — BASIC METABOLIC PANEL
Anion gap: 12 (ref 5–15)
BUN: 10 mg/dL (ref 6–20)
CO2: 20 mmol/L — ABNORMAL LOW (ref 22–32)
Calcium: 9 mg/dL (ref 8.9–10.3)
Chloride: 105 mmol/L (ref 98–111)
Creatinine, Ser: 0.96 mg/dL (ref 0.44–1.00)
GFR, Estimated: >60 mL/min (ref >60)
Glucose, Bld: 241 mg/dL — ABNORMAL HIGH (ref 70–99)
Potassium: 4.1 mmol/L (ref 3.5–5.1)
Sodium: 137 mmol/L (ref 135–145)

## 2020-12-17 LAB — CBC
HCT: 41.9 % (ref 36.0–46.0)
Hemoglobin: 13.3 g/dL (ref 12.0–15.0)
MCH: 27.5 pg (ref 26.0–34.0)
MCHC: 31.7 g/dL (ref 30.0–36.0)
MCV: 86.7 fL (ref 80.0–100.0)
Platelets: 241 10*3/uL (ref 150–400)
RBC: 4.83 MIL/uL (ref 3.87–5.11)
RDW: 14 % (ref 11.5–15.5)
WBC: 9.2 10*3/uL (ref 4.0–10.5)
nRBC: 0 % (ref 0.0–0.2)

## 2020-12-17 MED ORDER — OXYCODONE-ACETAMINOPHEN 5-325 MG PO TABS: 1.0000 | ORAL_TABLET | Freq: Four times a day (QID) | ORAL | 0 refills | Status: DC | PRN

## 2020-12-17 NOTE — Evaluation (Signed)
Occupational Therapy Evaluation Patient Details Name: Sarah Rocha MRN: [id number] DOB: [birth year] Today's Date: 12/17/2020    History of Present Illness 45 y.o. female, s/p cath by radial artery right side 6 days ago.  Pt had some electrical type pains in forearm day of cath. Since she went home she has had numbness of the entire right hand and weakness. s/p Thrombectomy right radial artery, patch angioplasty right radial artery  12/16/20   Clinical Impression    Pt PTA: Pt living at home with children and fiance. Pt was working and independent. Pt currently, Using L hand for all ADL as RUE impaired with coordination and lack of sensation. Pt education for looking at RUE when performing HEP, doing on L side as well and to look in front of a mirror. Pt given HEP handout and able to perform RUE exercises without cues. Pt performing ADL with supervisionA to minA overall, but has help from s/o and children at home as needed. 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 Pt stating she will take x1 week off. Pt modified independence for mobility OOB. Pt would benefit from OP OT for RUE AROM and increasing progression of HEP. OT signing off. Thank you for referral.    Follow Up Recommendations  Outpatient OT;Supervision - Intermittent    Equipment Recommendations  None recommended by OT    Recommendations for Other Services       Precautions / Restrictions Precautions Precautions: None Restrictions Weight Bearing Restrictions: No Other Position/Activity Restrictions: Assume reducing push/pull/lift with RUE until healed      Mobility Bed Mobility Overal bed mobility: Modified Independent             General bed mobility comments: HoB elevated, increased effort to get to EoB, no assist needed    Transfers Overall transfer level: Modified independent Equipment used: None              General transfer comment: increased time to power up and self steady    Balance Overall balance assessment: Modified Independent                                         ADL either performed or assessed with clinical judgement   ADL Overall ADL's : Needs assistance/impaired;Modified independent                                     Functional mobility during ADLs: Modified independent General ADL Comments: Using L hand for all ADL as RUE impaired with coordination and lack of sensation. Using L hand; pt able to place cup in R hand and raise slightly to mouth with LUE assisting. Pt with poor fine motor coordination due to loss of sensation in R hand/some of forearm. Pt performing all ADL tasks with increased time and LUE only. Pt education for looking at RUE when performing HEP, doing on L side as well and to look in front of a mirror. Pt given HEP handout.     Vision Baseline Vision/History: No visual deficits;Wears glasses Wears Glasses: Reading only Patient Visual Report: No change from baseline Vision Assessment?: No apparent visual deficits     Perception     Praxis      Pertinent  Vitals/Pain Pain Assessment: 0-10 Pain Score: 4  Pain Location: R forearm Pain Descriptors / Indicators: Sharp;Shooting Pain Intervention(s): Monitored during session     Hand Dominance Right   Extremity/Trunk Assessment Upper Extremity Assessment Upper Extremity Assessment: RUE deficits/detail RUE Deficits / Details: s/p R radial a. thrombectomy and revascularization RUE Sensation: decreased light touch;decreased proprioception RUE Coordination: decreased fine motor   Lower Extremity Assessment Lower Extremity Assessment: Overall WFL for tasks assessed   Cervical / Trunk Assessment Cervical / Trunk Assessment: Normal   Communication Communication Communication: No difficulties   Cognition Arousal/Alertness: Awake/alert Behavior During Therapy: WFL for  tasks assessed/performed Overall Cognitive Status: Within Functional Limits for tasks assessed                                     General Comments  R wrist incision, no redness/swelling noted    Exercises Exercises: Other exercises Other Exercises Other Exercises: elbow flex/ext; wrist flex/ext; finger flex/ext and supination/pronation; finger to thumb opposition x10 reps   Shoulder Instructions      Home Living Family/patient expects to be discharged to:: Private residence Living Arrangements: Spouse/significant other;Children Available Help at Discharge: Family;Available 24 hours/day Type of Home: House Home Access: Stairs to enter [employer] of Steps: 3 Entrance Stairs-Rails: Can reach both Home Layout: One level     Bathroom Shower/Tub: Chief Strategy Officer: Standard Bathroom Accessibility: Yes   Home Equipment: None          Prior Functioning/Environment Level of Independence: Independent        Comments: Art therapist of hotel        OT Problem List: Decreased strength;Decreased range of motion;Decreased coordination;Impaired UE functional use;Increased edema      OT Treatment/Interventions:      OT Goals(Current goals can be found in the care plan section) Acute Rehab OT Goals Patient Stated Goal: go home and get back to going to auctions OT Goal Formulation: All assessment and education complete, DC therapy  OT Frequency:     Barriers to D/C:            Co-evaluation              AM-PAC OT "6 Clicks" Daily Activity     Outcome Measure Help from another person eating meals?: A Little Help from another person taking care of personal grooming?: A Little Help from another person toileting, which includes using toliet, bedpan, or urinal?: A Little Help from another person bathing (including washing, rinsing, drying)?: A Little Help from another person to put on and taking off regular upper body  clothing?: A Little Help from another person to put on and taking off regular lower body clothing?: A Little 6 Click Score: 18   End of Session Nurse Communication: Mobility status  Activity Tolerance: Patient tolerated treatment well Patient left: in bed;with call bell/phone within reach  OT Visit Diagnosis: Muscle weakness (generalized) (M62.81)                Time: 1130-1150 OT Time Calculation (min): 20 min Charges:  OT General Charges $OT Visit: 1 Visit OT Evaluation $OT Eval Moderate Complexity: 1 Mod  Flora Lipps, OTR/L Acute Rehabilitation Services Pager: [phone] Office: [phone]   Sarah Rocha  C 12/17/2020, 12:09 PM

## 2020-12-17 NOTE — Evaluation (Signed)
Physical Therapy Evaluation Patient Details Name: Sarah Rocha MRN: [id number] DOB: 11/14/1975 Today's Date: 12/17/2020   History of Present Illness  45 y.o. female, s/p cath by radial artery right side 6 days ago.  Pt had some electrical type pains in forearm day of cath. Since she went home she has had numbness of the entire right hand and weakness. s/p Thrombectomy right radial artery, patch angioplasty right radial artery  12/16/20  Clinical Impression  Patient evaluated by Physical Therapy with no further acute PT needs identified. All education has been completed and the patient has no further questions. Pt has no follow-up Physical Therapy or equipment needs. PT is signing off. Thank you for this referral.     Follow Up Recommendations No PT follow up;Supervision - Intermittent    Equipment Recommendations  None recommended by PT       Precautions / Restrictions Precautions Precautions: None Restrictions Weight Bearing Restrictions: No      Mobility  Bed Mobility Overal bed mobility: Modified Independent             General bed mobility comments: HoB elevated, increased effort to get to EoB, no assist needed    Transfers Overall transfer level: Modified independent Equipment used: None             General transfer comment: increased time to power up and self steady  Ambulation/Gait Ambulation/Gait assistance: Modified independent (Device/Increase time) Gait Distance (Feet): 450 Feet Assistive device: None Gait Pattern/deviations: Step-through pattern;Decreased step length - right;Decreased step length - left Gait velocity: slowed Gait velocity interpretation: 1.31 - 2.62 ft/sec, indicative of limited community ambulator General Gait Details: mod I for increased time, R UE elevated for comfort, one time standing rest break for dizziness, likely due to recent administration of pain medication         Balance Overall balance assessment: Modified  Independent                                           Pertinent Vitals/Pain Pain Assessment: 0-10 Pain Score: 4  Pain Location: R forearm Pain Descriptors / Indicators: Sharp;Shooting Pain Intervention(s): Limited activity within patient's tolerance;Monitored during session;Repositioned    Home Living Family/patient expects to be discharged to:: Private residence Living Arrangements: Spouse/significant other;Children Available Help at Discharge: Family;Available 24 hours/day Type of Home: House Home Access: Stairs to enter Entrance Stairs-Rails: Can reach both Entrance Stairs-Number of Steps: 3 Home Layout: One level Home Equipment: None      Prior Function Level of Independence: Independent         Comments: Art therapist of hotel     Hand Dominance   Dominant Hand: Right    Extremity/Trunk Assessment   Upper Extremity Assessment Upper Extremity Assessment: Defer to OT evaluation    Lower Extremity Assessment Lower Extremity Assessment: Overall WFL for tasks assessed    Cervical / Trunk Assessment Cervical / Trunk Assessment: Normal  Communication   Communication: No difficulties  Cognition Arousal/Alertness: Awake/alert Behavior During Therapy: WFL for tasks assessed/performed Overall Cognitive Status: Within Functional Limits for tasks assessed                                        General Comments General comments (skin integrity, edema, etc.): R wrist incision well approximated, minor edema,  VSS on RA,        Assessment/Plan    PT Assessment Patent does not need any further PT services         PT Goals (Current goals can be found in the Care Plan section)  Acute Rehab PT Goals Patient Stated Goal: go home and get back to going to auctions PT Goal Formulation: With patient     AM-PAC PT "6 Clicks" Mobility  Outcome Measure Help needed turning from your back to your side while in a flat bed without  using bedrails?: None Help needed moving from lying on your back to sitting on the side of a flat bed without using bedrails?: None Help needed moving to and from a bed to a chair (including a wheelchair)?: None Help needed standing up from a chair using your arms (e.g., wheelchair or bedside chair)?: None Help needed to walk in hospital room?: None Help needed climbing 3-5 steps with a railing? : None 6 Click Score: 24    End of Session   Activity Tolerance: Patient tolerated treatment well Patient left: in chair;with call bell/phone within reach;with nursing/sitter in room Nurse Communication: Mobility status PT Visit Diagnosis: Dizziness and giddiness (R42)    Time: 2633-3545 PT Time Calculation (min) (ACUTE ONLY): 20 min   Charges:   PT Evaluation $PT Eval Low Complexity: 1 Low          Sarah Rocha B. Beverely Risen PT, DPT Acute Rehabilitation Services Pager [phone] Office [phone]   Sarah Rocha 12/17/2020, 10:30 AM

## 2020-12-17 NOTE — Telephone Encounter (Signed)
Spoke with the patient who states that she had emergency surgery for her artery and she has a damaged nerve. Pt states she is still in pain but is better. Pt had no additional questions.

## 2020-12-17 NOTE — Progress Notes (Signed)
Upper extremity arterial RT study completed.  Preliminary results relayed to Christa, RN for Darrick Penna, MD in the OR.   See CV Proc for preliminary results report.   Jean Rosenthal, RDMS

## 2020-12-17 NOTE — Progress Notes (Signed)
Patient feels like her hand may be a little bit better this morning as far as motor function.  She still has diffuse numbness extending from the wrist all the way into the hand.  She still has some pain in the right forearm but this is slightly improved.  On exam she has no hematoma.  She does have a radial pulse but it is not as prominent as it was yesterday.  She has a 2+ ulnar pulse.  Assessment: Slight improvement in right hand but pulses not as prominent with some concern for rethrombosis.  Plan: We will keep patient n.p.o. for now.  We will get stat duplex of right upper extremity this morning.  If artery has rethrombosed she will need reexploration and probably formal arteriogram to make sure that she does not have a dissection more proximally.  Plan and details discussed with patient.  Fabienne Bruns, MD [facility] Office: [phone]

## 2020-12-17 NOTE — Telephone Encounter (Signed)
-----   Message from [name], LPN sent at 9/98/3382  6:34 PM EST ----- Hello Sarah Rocha  Dr Tresa Endo saw this patient in the office today.  Sarah Rocha

## 2020-12-17 NOTE — Discharge Summary (Signed)
  Discharge Summary  Patient ID: Sarah Rocha [id number] 45 y.o. 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 Caliah Kopke is a 45 year old female who is 1 week status post cardiac catheterization via right radial artery.  She was seen in the office by her cardiologist Dr. Tresa Endo on 12/16/2020 and she was found to have profound motor and sensation deficit of right hand.  Basilic vein.  She was brought urgently to the operating room by Dr. Darrick Penna and underwent thrombectomy of right radial artery with patch angioplasty of right radial artery.  Patient was patient was kept overnight for observation.  POD #1 arterial duplex demonstrated a widely patent right radial artery.  Patient was evaluated by PT and OT.  Recommendations included outpatient occupational therapy and a home exercise program was provided.  Patient will follow up in office with Dr. Darrick Penna in 2 to 3 weeks.  Patient will be discharged home in stable condition.  Consults: None  Treatments: surgery: Thrombectomy right radial artery with patch angioplasty of right radial artery by Dr. Darrick Penna on 12/16/2020  Discharge Exam: See progress note 12/17/20 Vitals:   12/17/20 1019 12/17/20 1206  BP: 131/81 133/71  Pulse: 73 (!) 101  Resp: 20 19  Temp: 99 F (37.2 C) 97.9 F (36.6 C)  SpO2: 100% 100%     Disposition: Discharge disposition: 01-Home or Self Care       Patient Instructions:  Allergies as of 12/17/2020      Reactions   Coconut Flavor Anaphylaxis, Swelling   Pineapple Anaphylaxis, Swelling      Medication List    TAKE these medications   acetaminophen 325 MG tablet Commonly known as:  TYLENOL Take 650 mg by mouth every 6 (six) hours as needed for mild pain or headache.   albuterol 108 (90 Base) MCG/ACT inhaler Commonly known as: VENTOLIN HFA Inhale 2 puffs into the lungs every 6 (six) hours as needed for wheezing.   aspirin EC 81 MG tablet Take 1 tablet (81 mg total) by mouth daily. Swallow whole.   gabapentin 400 MG capsule Commonly known as: NEURONTIN Take 400 mg by mouth at bedtime.   multivitamin with minerals tablet Take 1 tablet by mouth daily.   nitroGLYCERIN 0.4 MG SL tablet Commonly known as: NITROSTAT Place 1 tablet (0.4 mg total) under the tongue every 5 (five) minutes as needed. What changed: reasons to take this   oxyCODONE-acetaminophen 5-325 MG tablet Commonly known as: PERCOCET/ROXICET Take 1 tablet by mouth every 6 (six) hours as needed for moderate pain.      Activity: activity as tolerated Diet: regular diet Wound Care: keep wound clean and dry  Follow-up with Dr. Darrick Penna in 2 weeks.  Signed: Emilie Rutter, PA-C 12/17/2020 1:45 PM [facility] Office: [phone]

## 2020-12-17 NOTE — Progress Notes (Signed)
Patient's preliminary duplex reviewed.  Radial artery and ulnar artery and brachial artery are all patent.  Will advance diet now.  Patient needs to be seen by physical therapy and Occupational Therapy today.  We will consider discharge later today based on their recommendations.  Pt needs to go home on aspirin.  Fabienne Bruns, MD [facility] Office: [phone]

## 2020-12-19 ENCOUNTER — Ambulatory Visit (HOSPITAL_COMMUNITY)
Admission: RE | Admit: 2020-12-19 | Discharge: 2020-12-19 | Disposition: A | Discharge status: Home / Self Care | Payer: 59 | Source: Ambulatory Visit | Attending: Vascular Surgery | Admitting: Vascular Surgery

## 2020-12-19 ENCOUNTER — Telehealth: Payer: Self-pay

## 2020-12-19 ENCOUNTER — Ambulatory Visit (INDEPENDENT_AMBULATORY_CARE_PROVIDER_SITE_OTHER): Payer: 59 | Admitting: Vascular Surgery

## 2020-12-19 ENCOUNTER — Encounter: Payer: Self-pay | Admitting: Vascular Surgery

## 2020-12-19 ENCOUNTER — Other Ambulatory Visit: Payer: Self-pay

## 2020-12-19 ENCOUNTER — Other Ambulatory Visit (HOSPITAL_COMMUNITY): Payer: Self-pay | Admitting: Vascular Surgery

## 2020-12-19 VITALS — BP 138/89 | HR 65 | Temp 98.3°F | Resp 20 | Ht 65.0 in | Wt 249.0 lb

## 2020-12-19 DIAGNOSIS — R52 Pain, unspecified: Secondary | ICD-10-CM

## 2020-12-19 DIAGNOSIS — S55101D Unspecified injury of radial artery at forearm level, right arm, subsequent encounter: Secondary | ICD-10-CM

## 2020-12-19 NOTE — Progress Notes (Signed)
Patient is a 45 year old female who had thrombectomy and patch angioplasty of her right radial artery 3 days ago.  She noticed more swelling and pain in her forearm and upper arm today.  She has had no incisional drainage.  She still has underlying neurologic dysfunction in her hand which was present preoperatively.  She is moving the hand some.  She still has numbness throughout the hand.  Physical exam:  Vitals:   12/19/20 1043  BP: 138/89  Pulse: 65  Resp: 20  Temp: 98.3 F (36.8 C)  SpO2: 98%  Weight: 249 lb (112.9 kg)  Height: 5\' 5"  (1.651 m)    Right upper extremity: 2+ brachial 2+ ulnar pulse difficult to palpate radial pulse secondary to pain around the incision.  Incision is healing well with no breakdown currently.  There is no drainage.  She is able to extend and flex the wrist some and open and close the hand although it is weak.  She has decreased sensation as well.  Compartments are soft with mild tenderness to palpation on the volar and dorsal surface of the forearm.  This does not seem to be worse than her preoperative exam.  Data: Patient had a duplex ultrasound today which showed a patent radial artery and ulnar artery and brachial artery.  There was edema in the interstitial tissues but no focal hematoma.  Assessment: Edema right arm postoperative change her compartments are really unchanged from her preoperative exam and I do not believe she has a compartment syndrome.  Her radial artery is patent.  This probably already represents postoperative edema.  Patient will continue with some physical therapy exercises for her right hand.  She has follow-up scheduled with me in a few weeks.  She will call me sooner if she has any other problems.  , MD [facility] Office: [phone]

## 2020-12-19 NOTE — Telephone Encounter (Signed)
Pt called with c/o hand/wrist swelling "2-3x more than when left hospital" on Monday. She saw her PCP this morning who instructed her to call our office. Pt has been added to MD schedule this morning. She is aware and verbalized understanding.

## 2021-01-01 DIAGNOSIS — T8859XA Other complications of anesthesia, initial encounter: Secondary | ICD-10-CM | POA: Insufficient documentation

## 2021-01-02 ENCOUNTER — Ambulatory Visit (INDEPENDENT_AMBULATORY_CARE_PROVIDER_SITE_OTHER): Payer: 59 | Admitting: Physician Assistant

## 2021-01-02 ENCOUNTER — Other Ambulatory Visit: Payer: Self-pay

## 2021-01-02 VITALS — BP 127/88 | HR 81 | Temp 97.8°F | Resp 20 | Ht 65.0 in | Wt 248.3 lb

## 2021-01-02 DIAGNOSIS — S55101D Unspecified injury of radial artery at forearm level, right arm, subsequent encounter: Secondary | ICD-10-CM

## 2021-01-02 NOTE — Progress Notes (Signed)
POST OPERATIVE OFFICE NOTE    CC:  F/u for surgery  HPI:  This is a 45 y.o. female who is s/p thrombectomy of right radial artery with patch angioplasty to right radial artery on 12/16/2020 by Dr. Darrick Penna.    She was s/p cath by radial artery right side on 12/10/2020.  Pt had some electrical type pains in forearm day of cath. Since she went home she has had numbness of the entire right hand and weakness.  She also has pain in the right forearm.  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  Her cath showed no coronary obstruction and normal EF. Other medical problems include peripheral neuropathy for which she takes gabapentin.  She was referred to Dr. Darrick Penna.   Pt returns today for follow up.  Pt states her hand is not really any better than prior to her surgery.  She does not have any sensation in the left hand and minimal motor function.  She is unable to completely make a fist and she struggles to straighten fingers but she can do this.  Her fiance helps her with her daily tasks.   She is working with OT - her insurance co pay for this is $100/visit.  She has numbness over where the incision is and she states she has knocked it few times and it opened up but is now closed and healing.  She states there is a small knot under the incision but this is not getting any worse.  She is keeping a positive attitude and working hard to regain function.   Allergies  Allergen Reactions  . Coconut Flavor Anaphylaxis and Swelling  . Pineapple Anaphylaxis and Swelling    Current Outpatient Medications  Medication Sig Dispense Refill  . acetaminophen (TYLENOL) 325 MG tablet Take 650 mg by mouth every 6 (six) hours as needed for mild pain or headache.    . albuterol (VENTOLIN HFA) 108 (90 Base) MCG/ACT inhaler Inhale 2 puffs into the lungs every 6 (six) hours as needed for wheezing.    Marland Kitchen aspirin EC 81 MG tablet Take 1 tablet (81 mg total) by mouth daily. Swallow  whole. 90 tablet 3  . gabapentin (NEURONTIN) 400 MG capsule Take 400 mg by mouth at bedtime.    . Multiple Vitamins-Minerals (MULTIVITAMIN WITH MINERALS) tablet Take 1 tablet by mouth daily.    . nitroGLYCERIN (NITROSTAT) 0.4 MG SL tablet Place 1 tablet (0.4 mg total) under the tongue every 5 (five) minutes as needed. (Patient taking differently: Place 0.4 mg under the tongue every 5 (five) minutes as needed for chest pain.) 25 tablet 6  . oxyCODONE-acetaminophen (PERCOCET/ROXICET) 5-325 MG tablet Take 1 tablet by mouth every 6 (six) hours as needed for moderate pain. 20 tablet 0   No current facility-administered medications for this visit.     ROS:  See HPI  Physical Exam:  Today's Vitals   01/02/21 1124  BP: 127/88  Pulse: 81  Resp: 20  Temp: 97.8 F (36.6 C)  TempSrc: Temporal  SpO2: 99%  Weight: 248 lb 4.8 oz (112.6 kg)  Height: 5\' 5"  (1.651 m)  PainSc: 6   PainLoc: Arm   Body mass index is 41.32 kg/m.   Incision:  Healing; there is a scabbed area but this is not dehisced.  There is no drainage.  Small hematoma proximal incision.  Extremities:  +palpable right radial pulse; she has brisk biphasic doppler flow right radial and  ulnar arteries.  Her left radial artery is palpable as well.  Neuro: she does not have any sensation in her finger tips.  She has minimal motor and takes effort to straighten fingers and attempt to make a fist.     Assessment/Plan:  This is a 45 y.o. female who is s/p:  thrombectomy of right radial artery with patch angioplasty to right radial artery on 12/16/2020 by Dr. Darrick Penna and cardiac catheterization via right radial artery.   -pt has palpable right radial pulse with biphasic doppler flow at the right radial and ulnar arteries.  Sensation is not in tact and there is minimal motor function, which is unchanged from before her surgery.  She is currently working with OT.  Discussed with her that it has only been 6 weeks and only time will tell how  much function/sensation returns.  She was very thankful for Dr. Darrick Penna.  I discussed with Dr. Darrick Penna and pt does not need to come back and see [place] unless she is having issues.  Pt and I discussed this and she is comfortable with this and knows to call us without hesitation if she needs to be seen.  She expressed understanding.   -she will continue her aspirin.   Doreatha Massed, [facility] [phone]   Clinic MD:  Darrick Penna

## 2021-01-03 ENCOUNTER — Encounter: Payer: Self-pay | Admitting: Cardiology

## 2021-01-03 ENCOUNTER — Ambulatory Visit (INDEPENDENT_AMBULATORY_CARE_PROVIDER_SITE_OTHER): Payer: 59 | Admitting: Cardiology

## 2021-01-03 VITALS — BP 120/78 | HR 68 | Ht 66.0 in | Wt 250.8 lb

## 2021-01-03 DIAGNOSIS — S55101S Unspecified injury of radial artery at forearm level, right arm, sequela: Secondary | ICD-10-CM

## 2021-01-03 DIAGNOSIS — Z87891 Personal history of nicotine dependence: Secondary | ICD-10-CM

## 2021-01-03 NOTE — Patient Instructions (Signed)

## 2021-01-03 NOTE — Progress Notes (Signed)
Cardiology Office Note:    Date:  01/03/2021   ID:  Sarah Rocha, DOB 02/06/1976, MRN [id number]  PCP:  Sarah Kendall, NP  Cardiologist:  Sarah Brothers, MD   Referring MD: Sarah Kendall, NP    ASSESSMENT:    1. Injury of right radial artery, sequela   2. Morbid obesity (HCC)   3. Ex-smoker    PLAN:    In order of problems listed above:  1. Chest tightness on exertion: I reassured her of this finding.  Fortunately her coronary angiography TSH was unremarkable and she is happy about it. 2. The right radial artery thrombus formation post procedure: Patient has undergone thrombectomy and the results of the vascular aspect of the surgery were satisfactory according to the patient and her vascular surgeon.  She still has issues with strength in her right hand and this is being followed by her primary care physician, therapist for physical therapy and patient is being followed by vascular surgery and is going to be referred to neurology by the vascular surgeon.  This is the information she gave me today. 3. Ex-smoker: Promises never to go back. 4. Obesity: Risks emphasized weight reduction stressed.  Importance of regular exercise stressed.  She will be seen in follow-up on a as needed basis.  I told her to give me a call if she has any questions or if she needs any assistance from my side.  Overall she was happy with this visit and the support that we have given her during the times that she has called the office.   Medication Adjustments/Labs and Tests Ordered: Current medicines are reviewed at length with the patient today.  Concerns regarding medicines are outlined above.  No orders of the defined types were placed in this encounter.  No orders of the defined types were placed in this encounter.    No chief complaint on file.    History of Present Illness:    Sarah Rocha is a 45 y.o. female.  Patient was evaluated by me.  She has multiple risk factors for coronary  artery disease.  History of obesity, sedentary lifestyle and history of smoking.  She had mentioned to me that she had chest tightness on exertion.  This was consistently happening on exertion and going to the left side of her upper extremity.  She was very concerned about this and even had passed out on 1 occasion.  Therefore she was given options of evaluation invasive and noninvasive.  She preferred coronary angiography in a conventional manner.  I had also given her options of stress testing and coronary angiography with CT FFR.  Subsequently she underwent coronary angiography and fortunately results were fine in terms of coronary angiography report with preserved ejection fraction and anatomically unremarkable coronary arteries.  Unfortunately she developed a thrombus in the radial artery.  She had called our office in [place] and subsequently called [place].  She was advised to go to the emergency room as soon as possible and she mentions to me that she did.  Eventually she was diagnosed to have thrombus in the radial artery at the site of the access and promptly was sent for surgery which was successful.  The patient subsequently since the time of the angiography mentions to me that she has lost function of her hand below the wrist.  She has been told that this probably is nerve damage and is being referred by her vascular colleagues to neurology.  She is undergoing therapy and  she tells me that it has not helped her much.  Past Medical History:  Diagnosis Date  . Abdominal hernia   . Abnormal uterine and vaginal bleeding, unspecified   . Acute bronchitis, unspecified   . Chest pain of uncertain etiology 12/04/2020  . Chest pain on breathing   . Complication of anesthesia    hard to wake  . Lightheadedness   . Morbid obesity (HCC)   . Neuropathy   . 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  . Shortness of breath   . Syncope and collapse   . Vitamin D deficiency     Past Surgical History:  Procedure  Laterality Date  . APPENDECTOMY    . CESAREAN SECTION    . CHOLECYSTECTOMY    . gastric bypass sleeve    . HERNIA REPAIR    . LEFT HEART CATH AND CORONARY ANGIOGRAPHY N/A 12/10/2020   Procedure: LEFT HEART CATH AND CORONARY ANGIOGRAPHY;  Surgeon: Sarah Bihari, MD;  Location: [facility];  Service: Cardiovascular;  Laterality: N/A;  . PATCH ANGIOPLASTY Right 12/16/2020   Procedure: PATCH ANGIOPLASTY RIGHT RADIAL ARTERY USING Livia Snellen BIOLOGIC PATCH;  Surgeon: Sarah Kerns, MD;  Location: [facility] OR;  Service: Vascular;  Laterality: Right;  . SKIN TAG REMOVAL    . THROMBECTOMY AND REVISION OF ARTERIOVENTOUS (AV) GORETEX  GRAFT Right 12/16/2020   Procedure: EXPLORATION RIGHT RADIAL ARTEY THROMBECTOMY;  Surgeon: Sarah Kerns, MD;  Location: [facility] OR;  Service: Vascular;  Laterality: Right;  . TONSILLECTOMY      Current Medications: Current Meds  Medication Sig  . acetaminophen (TYLENOL) 325 MG tablet Take 650 mg by mouth every 6 (six) hours as needed for mild pain or headache.  . albuterol (VENTOLIN HFA) 108 (90 Base) MCG/ACT inhaler Inhale 2 puffs into the lungs every 6 (six) hours as needed for wheezing.  Marland Kitchen aspirin EC 81 MG tablet Take 1 tablet (81 mg total) by mouth daily. Swallow whole.  . gabapentin (NEURONTIN) 400 MG capsule Take 400 mg by mouth at bedtime.  . Multiple Vitamins-Minerals (MULTIVITAMIN WITH MINERALS) tablet Take 1 tablet by mouth daily.  . nitroGLYCERIN (NITROSTAT) 0.4 MG SL tablet Place 0.4 mg under the tongue every 5 (five) minutes as needed for chest pain.     Allergies:   Coconut flavor and Pineapple   Social History   Socioeconomic History  . Marital status: Single    Spouse name: Not on file  . Number of children: Not on file  . Years of education: Not on file  . Highest education level: Not on file  Occupational History  . Not on file  Tobacco Use  . Smoking status: Former Games developer  . Smokeless tobacco: Never Used  Vaping Use  . Vaping Use:  Never used  Substance and Sexual Activity  . Alcohol use: Never  . Drug use: Never  . Sexual activity: Not on file  Other Topics Concern  . Not on file  Social History Narrative  . Not on file   Social Determinants of Health   Financial Resource Strain: Not on file  Food Insecurity: Not on file  Transportation Needs: Not on file  Physical Activity: Not on file  Stress: Not on file  Social Connections: Not on file     Family History: The patient's family history includes Diabetes in her father and mother; Heart attack in her father and mother; Hypertension in her father.  ROS:   Please see the history of present illness.  All other systems reviewed and are negative.  EKGs/Labs/Other Studies Reviewed:    The following studies were reviewed today: I discussed coronary angiography report with the patient at length.   Recent Labs: 12/17/2020: BUN 10; Creatinine, Ser 0.96; Hemoglobin 13.3; Platelets 241; Potassium 4.1; Sodium 137  Recent Lipid Panel No results found for: CHOL, TRIG, HDL, CHOLHDL, VLDL, LDLCALC, LDLDIRECT  Physical Exam:    VS:  BP 120/78   Pulse 68   Ht 5\' 6"  (1.676 m)   Wt 250 lb 12.8 oz (113.8 kg)   SpO2 95%   BMI 40.48 kg/m     Wt Readings from Last 3 Encounters:  01/03/21 250 lb 12.8 oz (113.8 kg)  01/02/21 248 lb 4.8 oz (112.6 kg)  12/19/20 249 lb (112.9 kg)     GEN: Patient is in no acute distress HEENT: Normal NECK: No JVD; No carotid bruits LYMPHATICS: No lymphadenopathy CARDIAC: Hear sounds regular, 2/6 systolic murmur at the apex. RESPIRATORY:  Clear to auscultation without rales, wheezing or rhonchi  ABDOMEN: Soft, non-tender, non-distended MUSCULOSKELETAL:  No edema; No deformity.  Patient mentions to me that she has not much sensation in the right hand in the palm.  Patient also mentions to me that she hardly has any power in the right hand below the wrist.  She is being evaluated by her primary care provider, vascular surgery and  is being referred to neurology for this. SKIN: Warm and dry NEUROLOGIC:  Alert and oriented x 3 PSYCHIATRIC:  Normal affect   Signed, 12/21/20, MD  01/03/2021 9:16 AM    [facility]

## 2021-02-06 ENCOUNTER — Telehealth: Payer: Self-pay | Admitting: Cardiology

## 2021-02-06 NOTE — Telephone Encounter (Signed)
Patient c/o Palpitations:  High priority if patient c/o lightheadedness, shortness of breath, or chest pain  1) How long have you had palpitations/irregular HR/ Afib? Are you having the symptoms now? 3 days  2) Are you currently experiencing lightheadedness, SOB or CP? No  3) Do you have a history of afib (atrial fibrillation) or irregular heart rhythm? No  4) Have you checked your BP or HR? (document readings if available): 146/122  5) Are you experiencing any other symptoms? Numbness, tiredness, dizzy

## 2021-02-06 NOTE — Telephone Encounter (Signed)
I agree that the patient needs to go to the emergency department to be assessed acutely.

## 2021-02-06 NOTE — Telephone Encounter (Signed)
Pt advised to go to the ED for evaluation. Pt verbalized understanding and had no additional questions.

## 2021-02-06 NOTE — Telephone Encounter (Signed)
Pt states that she has been having episodes of chest pressure, left arm going numb and shortness of breath. Pt states that her heart feels like it is beating out of her chest. Pt also states that the pain goes into her back. Pt states that the episodes last approx. 1 hour and she has had 4 different episodes. Pt has not checked her heart rate and states that her PCP recently started her on BP medication and it was 169/112 yesterday. Pt has been advised to go to the ED but wants to see what we advised as she recently had a cath. Again pt was advised to go to the ED. Advised that Dr.Revankar is out of town and I will forward to DOD.

## 2021-04-09 ENCOUNTER — Other Ambulatory Visit: Payer: Self-pay | Admitting: Neurological Surgery

## 2021-04-09 DIAGNOSIS — M545 Low back pain, unspecified: Secondary | ICD-10-CM

## 2021-04-09 DIAGNOSIS — G959 Disease of spinal cord, unspecified: Secondary | ICD-10-CM

## 2021-04-09 DIAGNOSIS — M4800 Spinal stenosis, site unspecified: Secondary | ICD-10-CM

## 2021-04-11 ENCOUNTER — Other Ambulatory Visit: Payer: Self-pay

## 2021-04-11 ENCOUNTER — Ambulatory Visit
Admission: RE | Admit: 2021-04-11 | Discharge: 2021-04-11 | Disposition: A | Discharge status: Home / Self Care | Payer: 59 | Source: Ambulatory Visit | Attending: Neurological Surgery | Admitting: Neurological Surgery

## 2021-04-11 DIAGNOSIS — M545 Low back pain, unspecified: Secondary | ICD-10-CM

## 2021-04-11 DIAGNOSIS — G959 Disease of spinal cord, unspecified: Secondary | ICD-10-CM

## 2021-04-11 DIAGNOSIS — M4800 Spinal stenosis, site unspecified: Secondary | ICD-10-CM

## 2021-04-11 IMAGING — MR MR LUMBAR SPINE W/O CM
4 of 5 series · 26 of 48 positions shown · non-contrast
Comparison: Cervical and thoracic MRI today reported separately.
Lumbar radiographs [DATE]. CT Abdomen and Pelvis [DATE].

CLINICAL DATA: 45-year-old female with neck and back pain radiating
to both legs for 6 months. Bilateral extremity weakness and
intermittent numbness. No known injury.

EXAM:
MRI LUMBAR SPINE WITHOUT CONTRAST
TECHNIQUE: Multiplanar, multisequence MR imaging of the lumbar spine was
performed. No intravenous contrast was administered.

[Series 4: T2 · sagittal · 4.0mm · 0.59mm/px · 6 of 16 slices shown (1 of 2)]
[im 1/16]
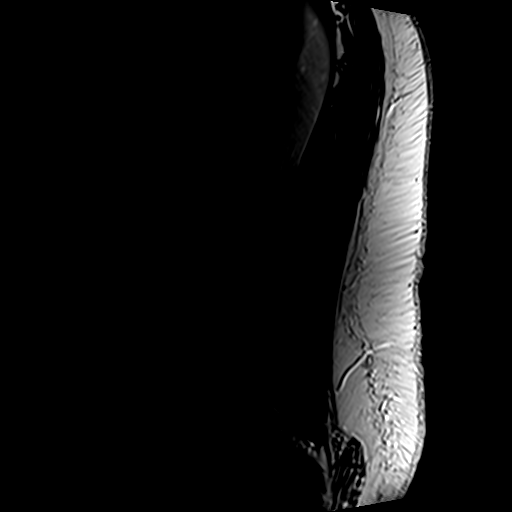
[im 4/16]
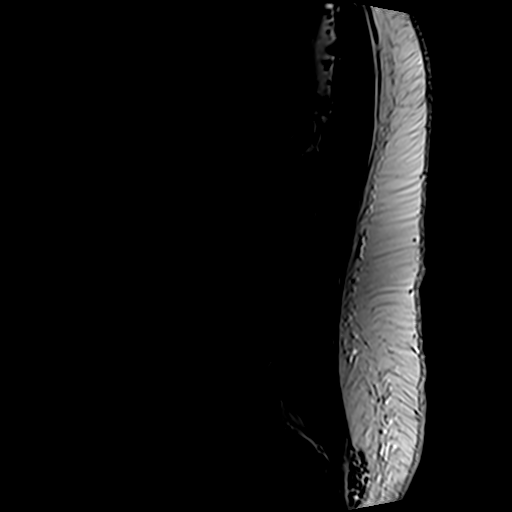
[im 7/16]
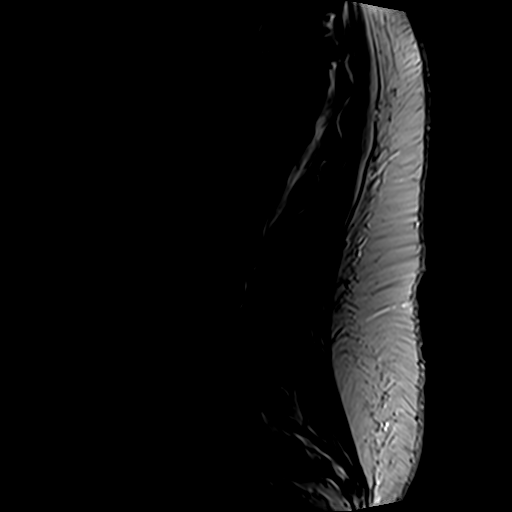
[im 10/16]
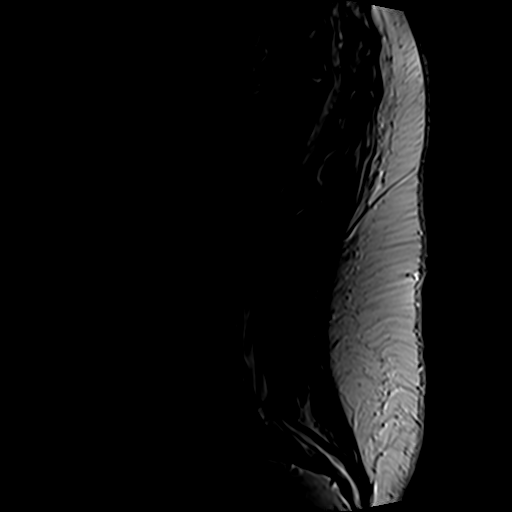
[im 13/16]
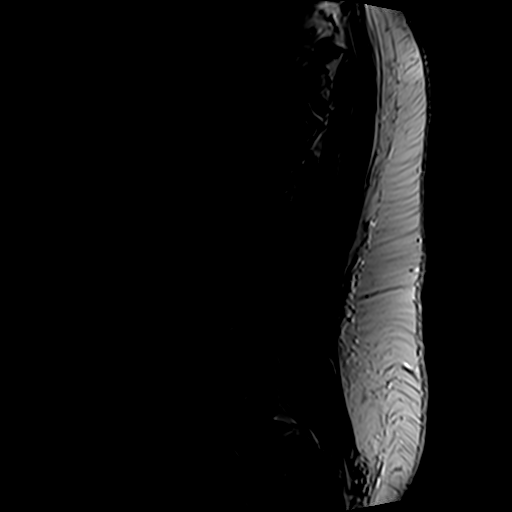
[im 16/16]
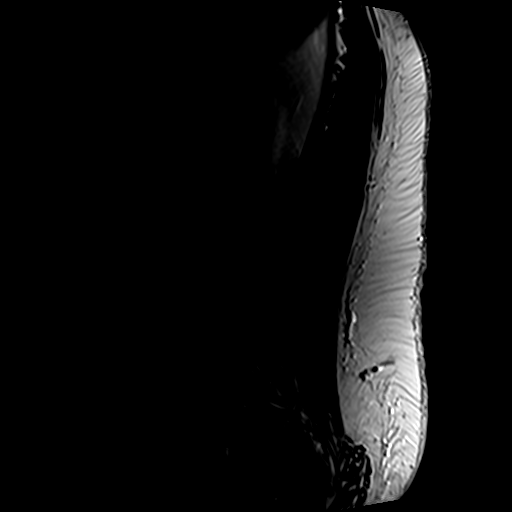

[Series 6: T1 · sagittal · 4.0mm · 0.55mm/px · 6 of 16 slices shown (1 of 2)]
[im 1/16]
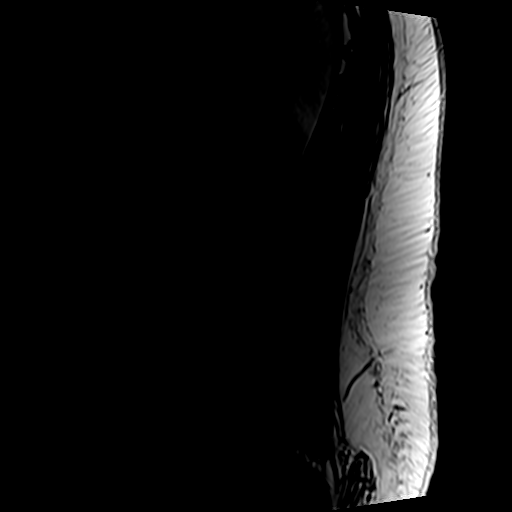
[im 4/16]
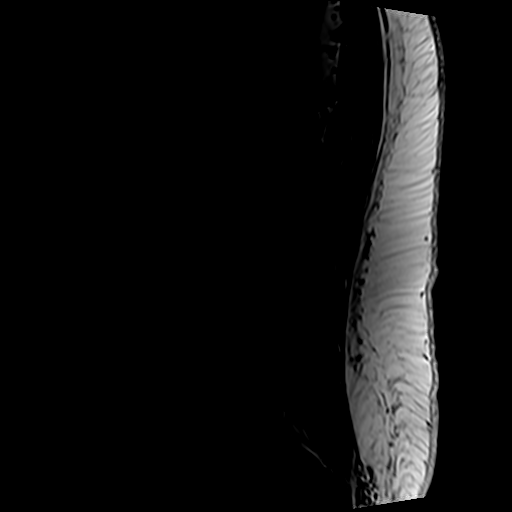
[im 7/16]
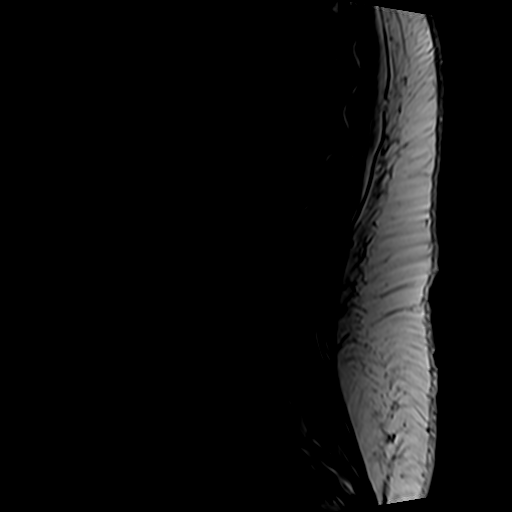
[im 10/16]
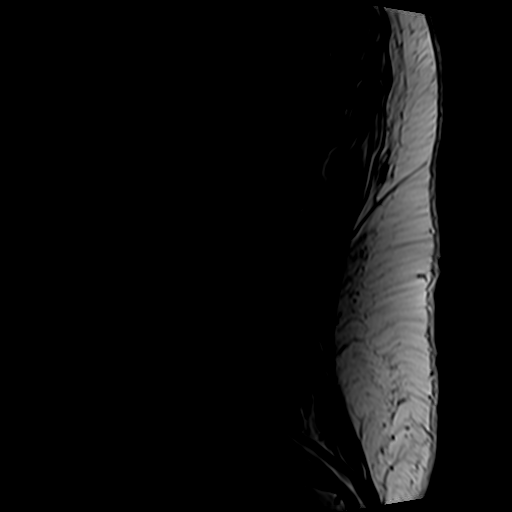
[im 13/16]
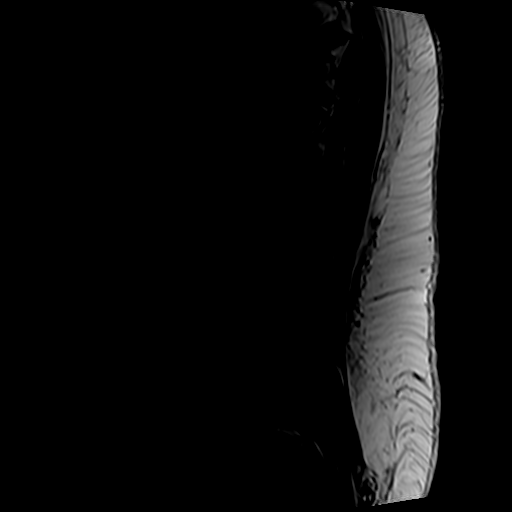
[im 16/16]
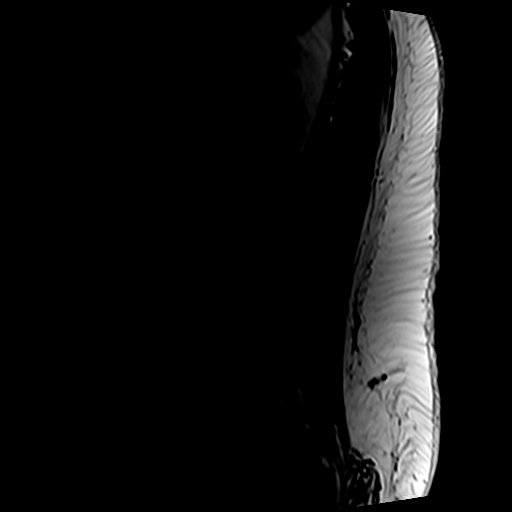

[Series 7: T2 · axial · 4.0mm · 0.78mm/px · z∈[-90,+112]mm · 9 of 39 slices shown (2 of 2)]
[im 1/39]
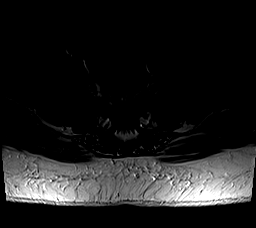
[im 6/39]
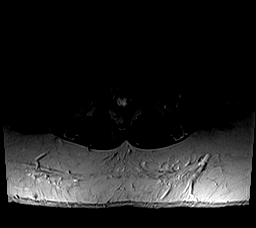
[im 11/39]
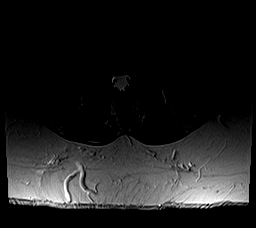
[im 17/39]
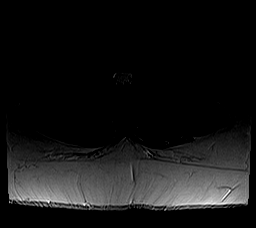
[im 20/39]
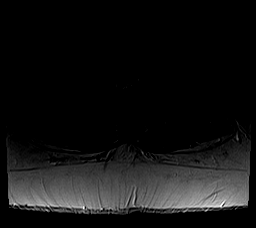
[im 22/39]
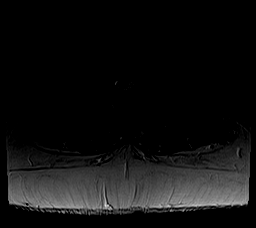
[im 28/39]
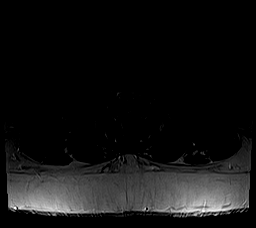
[im 33/39]
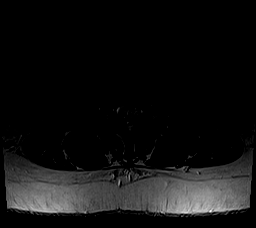
[im 39/39]
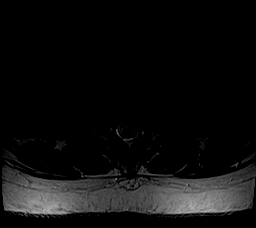

[Series 8: T1 · axial · 4.0mm · 0.39mm/px · z∈[-90,+81]mm · 5 of 39 slices shown (2 of 2)]
[im 1/39]
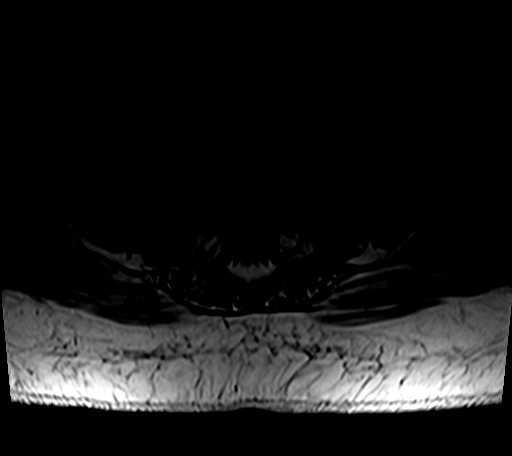
[im 6/39]
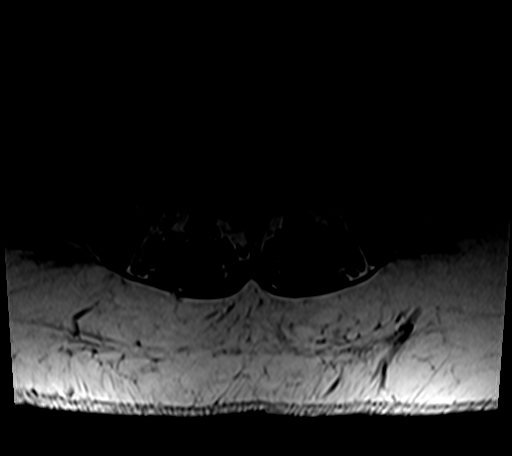
[im 11/39]
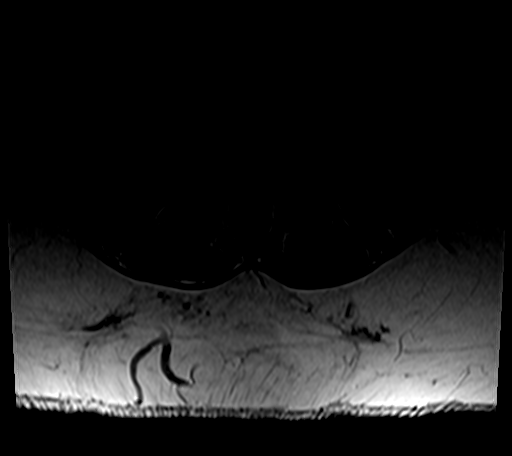
[im 20/39]
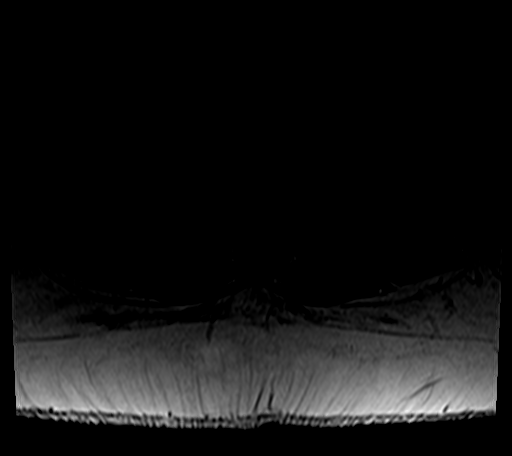
[im 33/39]
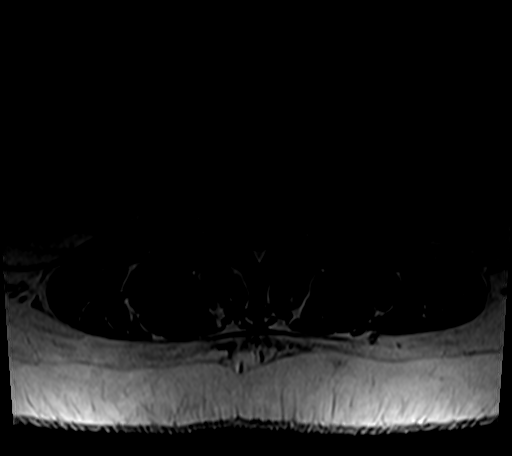

[26 of 48 positions shown; findings below may reference images not displayed]

FINDINGS: Segmentation: Normal, concordant with the thoracic spine numbering
today.

Alignment: Lumbar lordosis is preserved and stable since last year.
No spondylolisthesis.

Vertebrae: No marrow edema or evidence of acute osseous abnormality.
Visualized bone marrow signal is within normal limits. Intact
visible sacrum and SI joints.

Conus medullaris and cauda equina: Conus extends to the L1 level. No
lower spinal cord or conus signal abnormality. Cauda equina nerve
roots appear normal.

Paraspinal and other soft tissues: Negative.

Disc levels:

T12-L1: Anterior endplate spurring which appears to result in
interbody ankylosis on the CT Abdomen and Pelvis last year. Left
paracentral disc osteophyte complex (series 7, image 1) without
stenosis.

L1-L2:  Negative.

L2-L3:  Negative.

L3-L4: Subtle disc desiccation and mild facet hypertrophy. No
convincing stenosis.

L4-L5: Subtle disc desiccation and mild facet hypertrophy. No
convincing stenosis.

L5-S1:  Negative disc.  Mild facet hypertrophy.  No stenosis.
IMPRESSION: 1. Largely normal for age MRI appearance of the lumbar spine. Mild
lower lumbar facet hypertrophy with subtle disc degeneration. No
lumbar spinal stenosis or convincing neural impingement.

2. Evidence of interbody ankylosis in the lower thoracic spine
through T12-L1 related to anterior endplate osteophytes demonstrated
on prior CT.

## 2021-04-11 IMAGING — MR MR CERVICAL SPINE W/O CM
4 of 5 series · 30 of 48 positions shown · non-contrast
Comparison: Cervical spine radiographs [DATE].

CLINICAL DATA: 45-year-old female with neck and back pain radiating
to both legs for 6 months. Bilateral extremity weakness and
intermittent numbness. No known injury.

EXAM:
MRI CERVICAL SPINE WITHOUT CONTRAST
TECHNIQUE: Multiplanar, multisequence MR imaging of the cervical spine was
performed. No intravenous contrast was administered.

[Series 4: T2 · sagittal · 3.0mm · 0.82mm/px · 8 of 19 slices shown (1 of 2)]
[im 1/19]
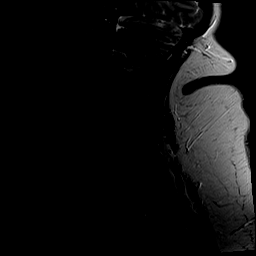
[im 3/19]
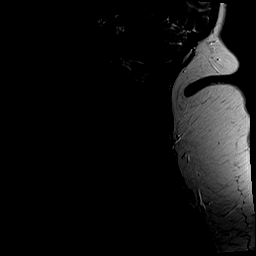
[im 6/19]
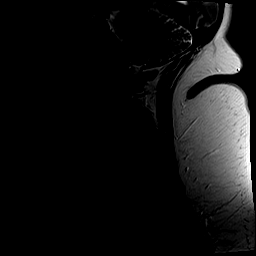
[im 8/19]
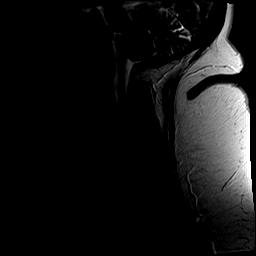
[im 11/19]
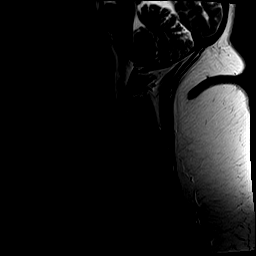
[im 13/19]
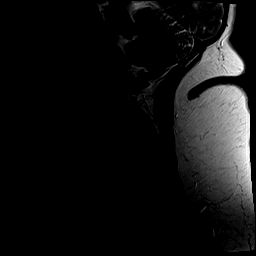
[im 16/19]
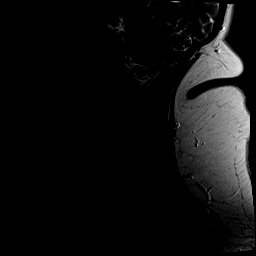
[im 19/19]
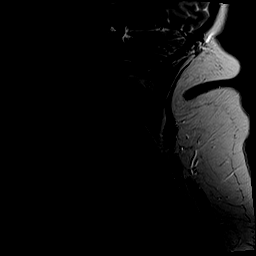

[Series 5: T1 · sagittal · 3.0mm · 0.41mm/px · 7 of 19 slices shown]
[im 1/19]
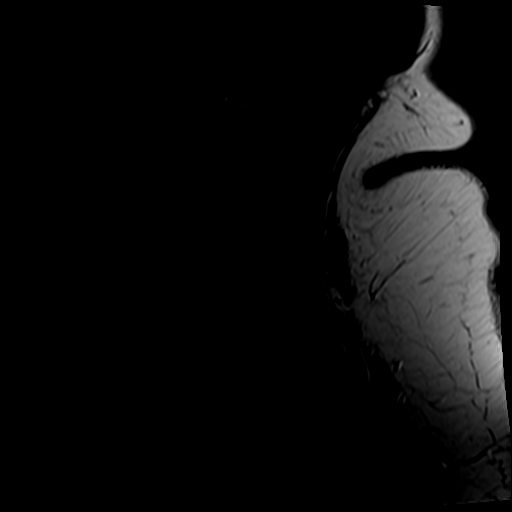
[im 4/19]
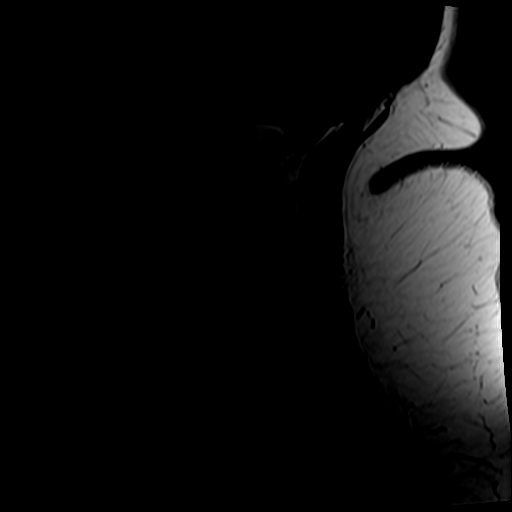
[im 7/19]
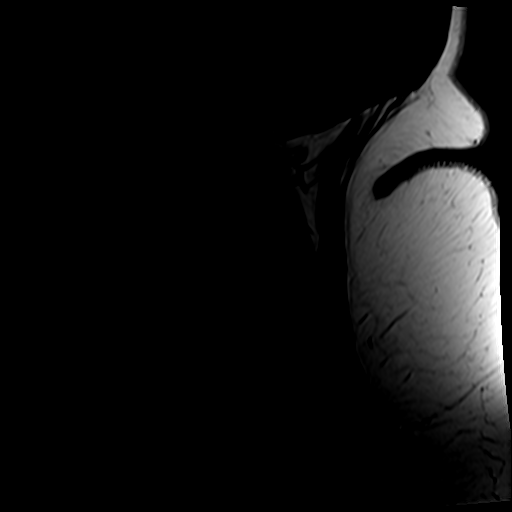
[im 10/19]
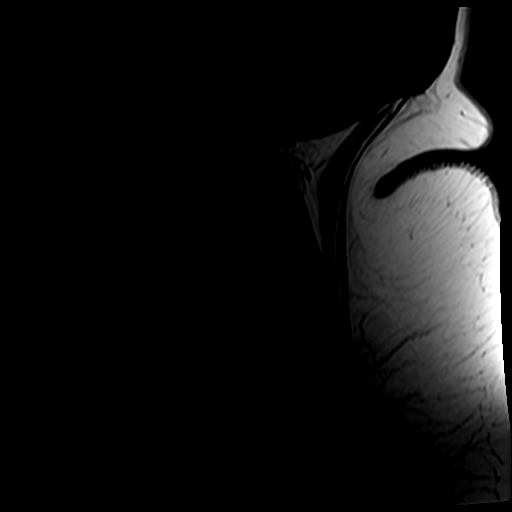
[im 13/19]
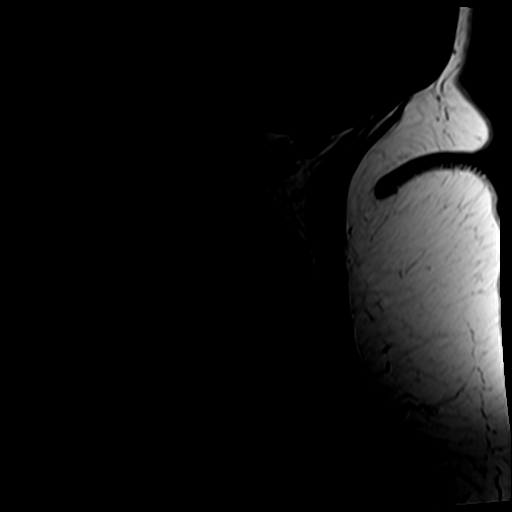
[im 16/19]
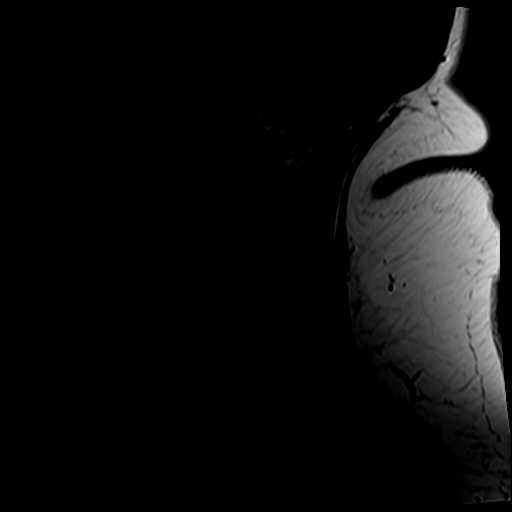
[im 19/19]
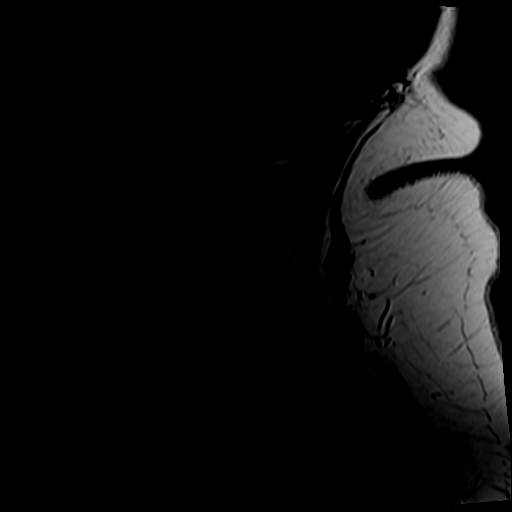

[Series 6: tir sag · sagittal · 3.0mm · 0.43mm/px · 6 of 19 slices shown]
[im 1/19]
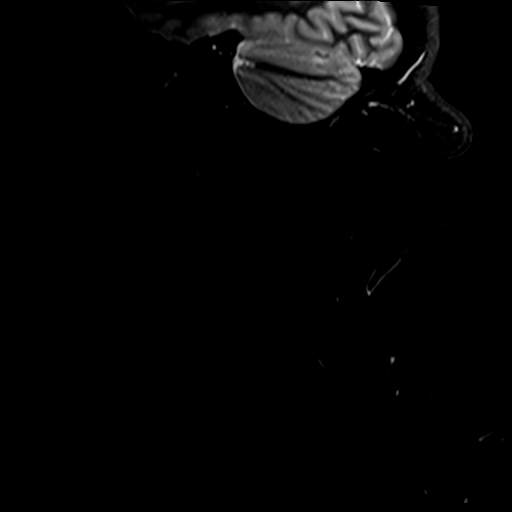
[im 4/19]
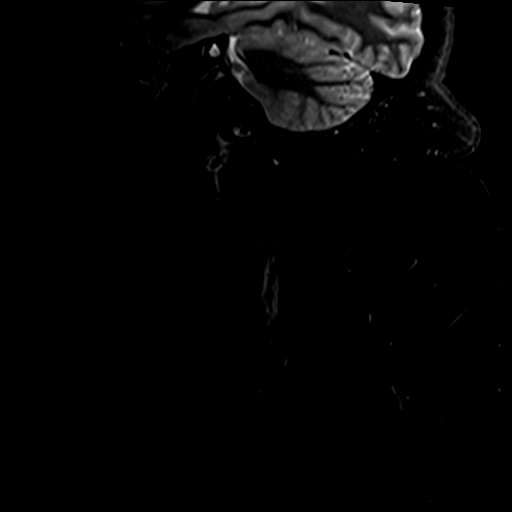
[im 7/19]
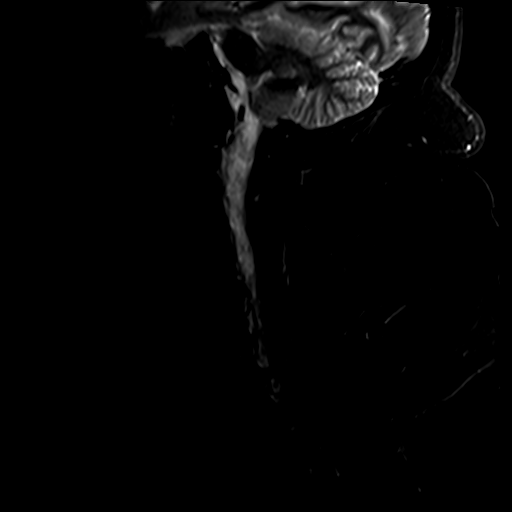
[im 10/19]
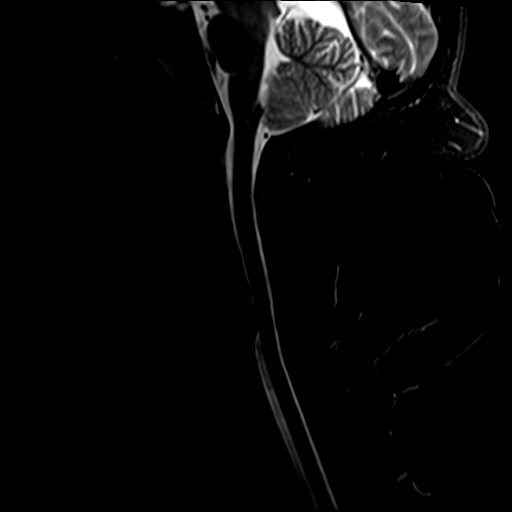
[im 13/19]
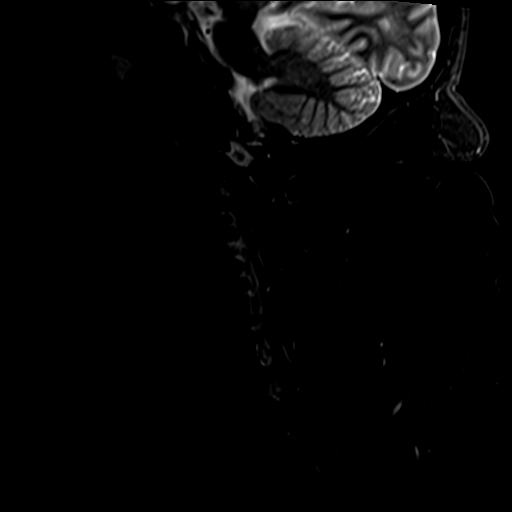
[im 16/19]
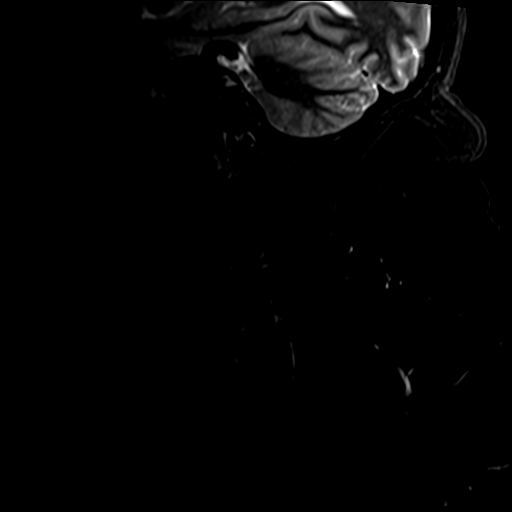

[Series 8: T2 · axial · 3.0mm · 0.78mm/px · z∈[-51,+79]mm · 9 of 36 slices shown (2 of 2)]
[im 1/36]
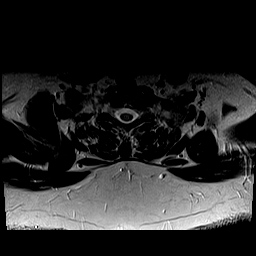
[im 6/36]
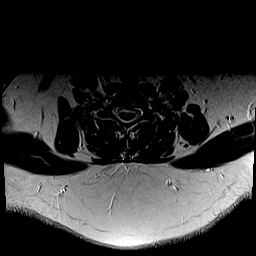
[im 12/36]
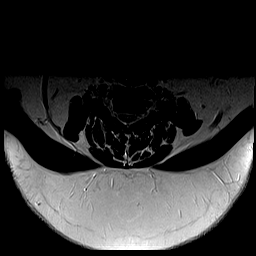
[im 15/36]
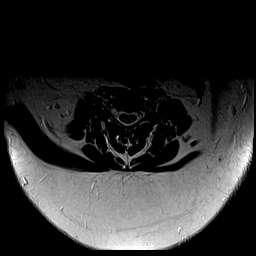
[im 18/36]
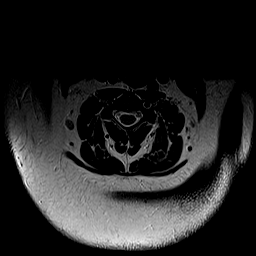
[im 21/36]
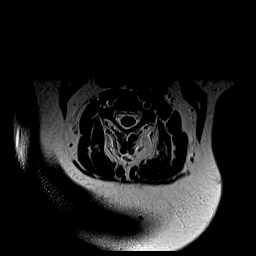
[im 24/36]
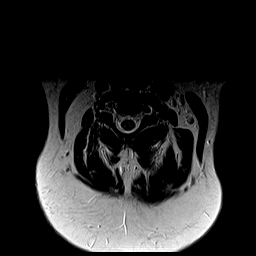
[im 30/36]
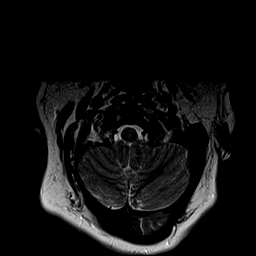
[im 36/36]
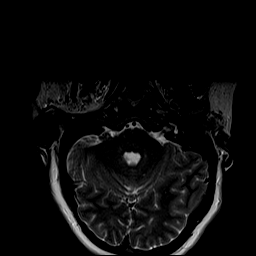

[30 of 48 positions shown; findings below may reference images not displayed]

FINDINGS: Alignment: Straightening of cervical lordosis is stable from the
radiographs last month. No spondylolisthesis.

Vertebrae: No marrow edema or evidence of acute osseous abnormality.
Visualized bone marrow signal is within normal limits.

Cord: No cervical cord signal abnormality despite some degenerative
spinal cord mass effect detailed below. Negative visible upper
thoracic cord.

Posterior Fossa, vertebral arteries, paraspinal tissues:
Cervicomedullary junction is within normal limits. Negative visible
posterior fossa. Preserved major vascular flow voids in the neck and
at the skull base. The left vertebral artery appears dominant.
Partially retropharyngeal course of the left carotid. Negative
visible neck soft tissues aside from large body habitus. Negative
visible lung apices.

Disc levels:

C2-C3:  Mild foraminal endplate spurring.  No stenosis.

C3-C4:  Negative.

C4-C5: Subtle left paracentral disc protrusion (series 8, image 20).
No stenosis.

C5-C6: Mild circumferential disc bulge with a broad-based mildly
lobulated posterior component of disc. Effaced ventral CSF space and
mild spinal stenosis. Mild if any cord mass effect. Bilateral
foraminal involvement by disc with mild bilateral C6 foraminal
stenosis.

C6-C7: Circumferential disc bulge with broad-based central disc
protrusion (series 8, image 29). Mild spinal stenosis and spinal
cord mass effect. But no foraminal involvement or stenosis.

C7-T1:  Negative.

Thoracic levels are detailed separately today.
IMPRESSION: Cervical disc degeneration C4-C5 through C6-C7. Broad-based
posterior disc protrusions at C5-C6 and C6-C7 with mild spinal
stenosis and up to mild spinal cord mass effect. No cord signal
abnormality. Mild bilateral C6 neural foraminal stenosis.

## 2021-04-11 IMAGING — MR MR THORACIC SPINE W/O CM
4 of 5 series · 18 of 48 positions shown · non-contrast
Comparison: Cervical spine MRI today reported separately. Thoracic
spine radiographs [DATE].
COMPARISON: Cervical spine MRI today reported separately. Thoracic
spine radiographs [DATE].

Addendum:
CLINICAL DATA: 45-year-old female with neck and back pain radiating
to both legs for 6 months. Bilateral extremity weakness and
intermittent numbness. No known injury.

EXAM:
MRI THORACIC SPINE WITHOUT CONTRAST
TECHNIQUE: Multiplanar, multisequence MR imaging of the thoracic spine was
performed. No intravenous contrast was administered.

[Series 5: STIR · sagittal · 3.0mm · 1.37mm/px · 3 of 22 slices shown]
[im 4/22]
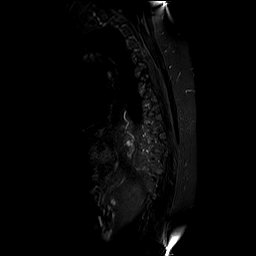
[im 11/22]
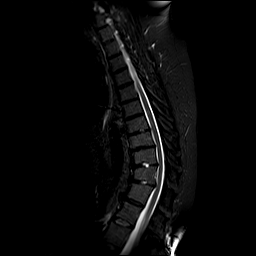
[im 18/22]
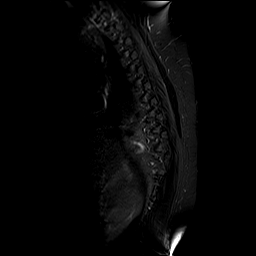

[Series 6: T2 post-contrast · sagittal · 3.0mm · 0.68mm/px · 7 of 22 slices shown]
[im 1/22]
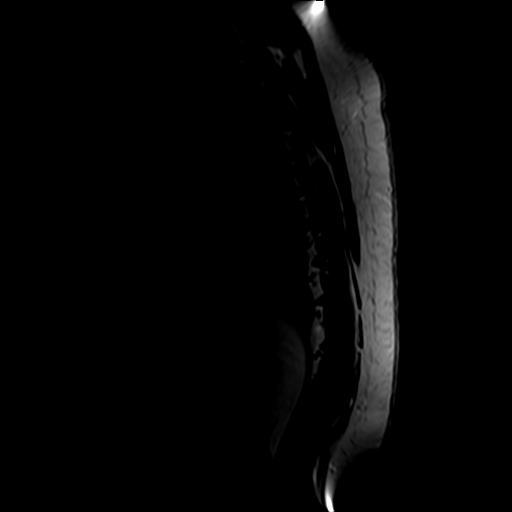
[im 4/22]
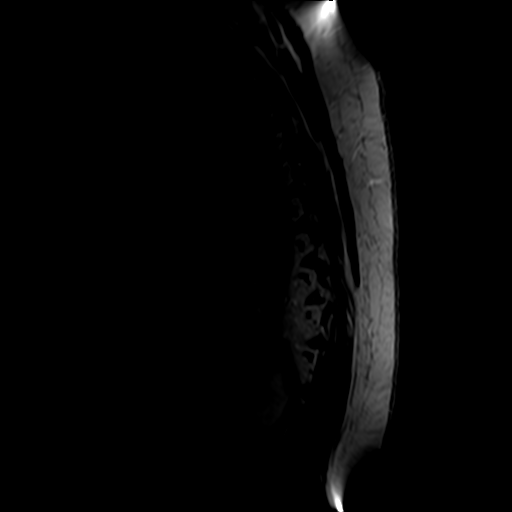
[im 8/22]
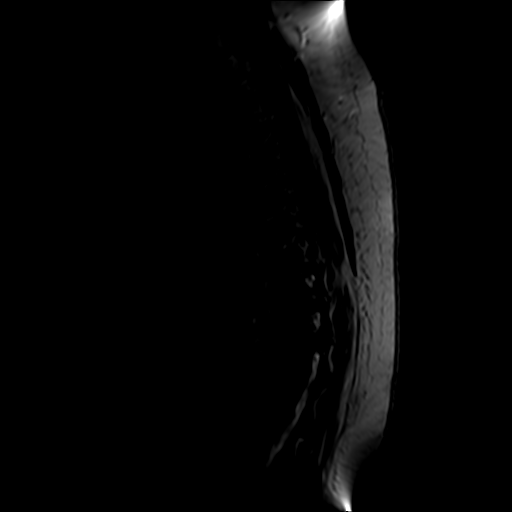
[im 11/22]
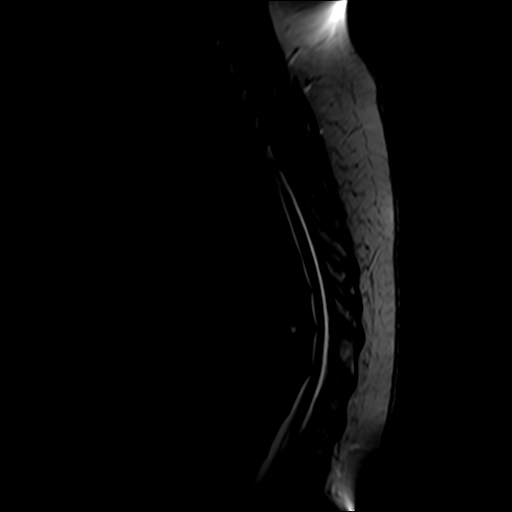
[im 15/22]
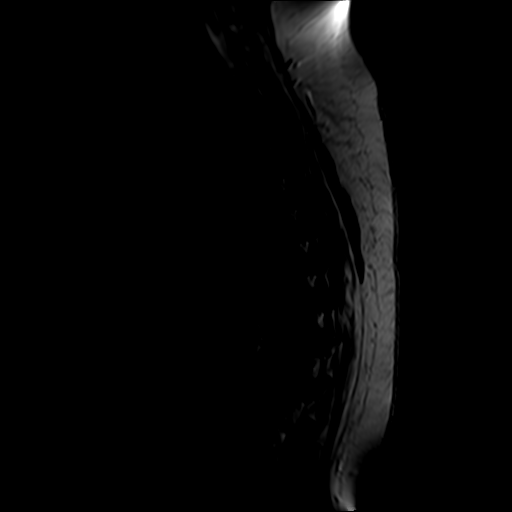
[im 18/22]
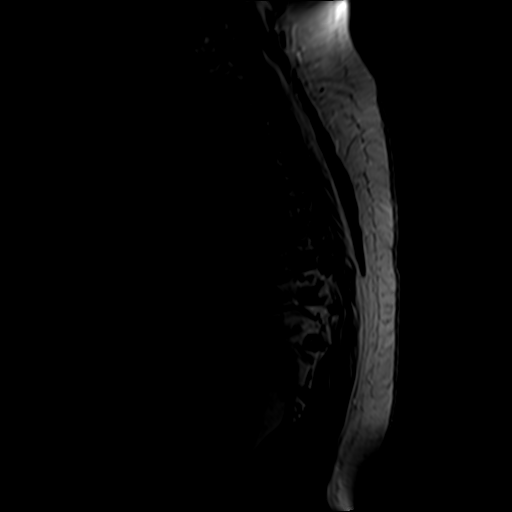
[im 22/22]
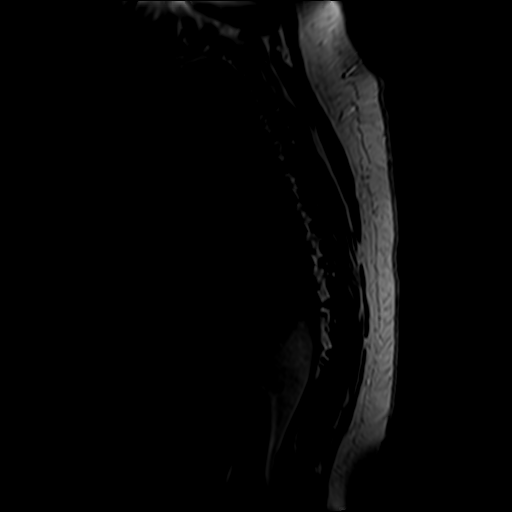

[Series 7: T1 · sagittal · 3.0mm · 0.68mm/px · 3 of 22 slices shown]
[im 5/22]
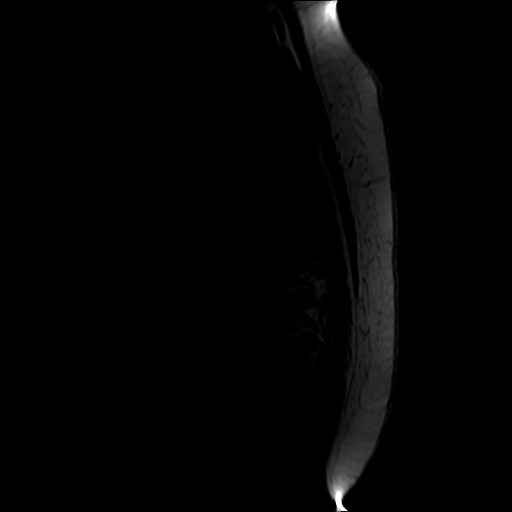
[im 13/22]
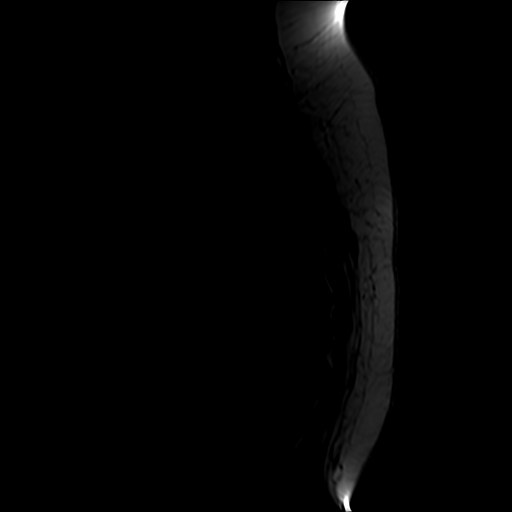
[im 22/22]
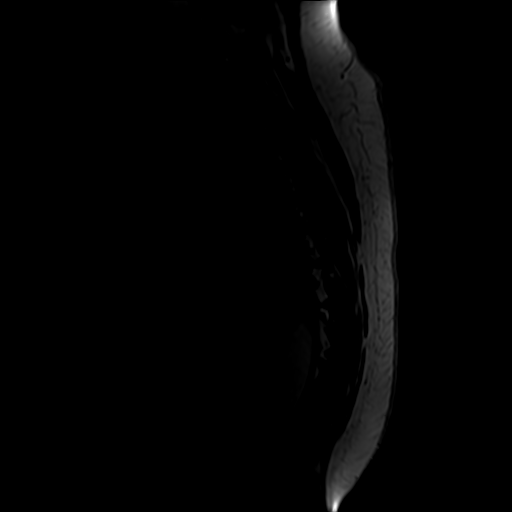

[Series 8: T2 · axial · 4.0mm · 0.39mm/px · z∈[-341,-106]mm · 5 of 50 slices shown]
[im 1/50]
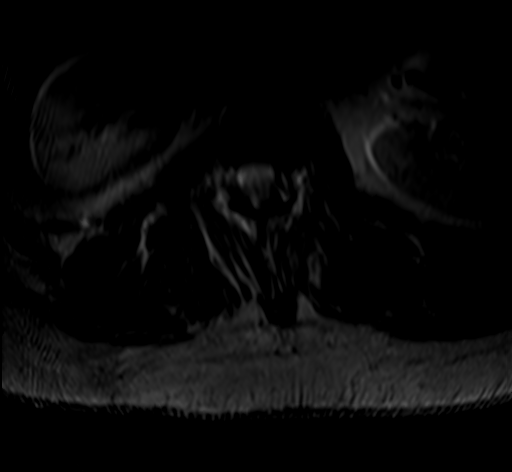
[im 8/50]
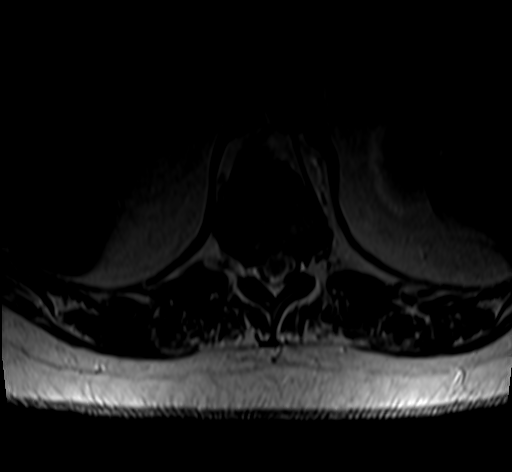
[im 16/50]
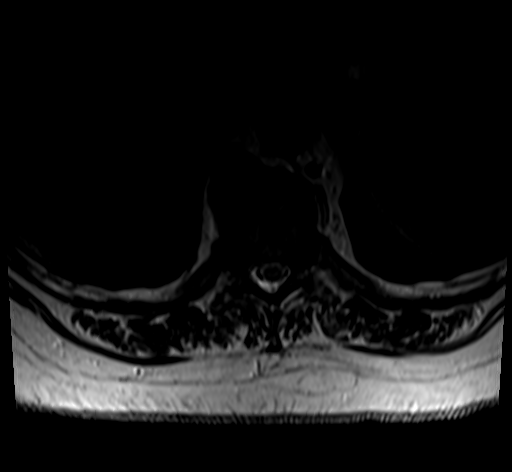
[im 27/50]
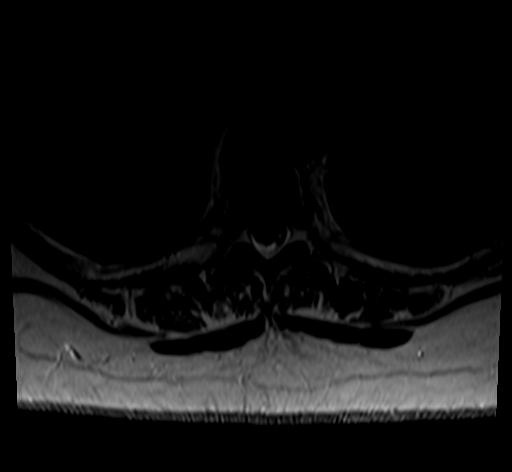
[im 42/50]
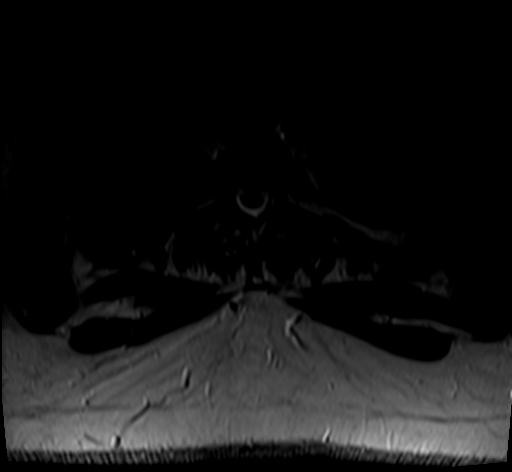

[18 of 48 positions shown; findings below may reference images not displayed]

FINDINGS: Limited cervical spine imaging:  Detailed separately today.

Thoracic spine segmentation:  Appears normal.

Alignment: Straightening of the thoracic spine from T1 through T9,
with exaggerated thoracic kyphosis T10 through T12 which is stable
to the radiographs last month. No scoliosis. No spondylolisthesis.

Vertebrae: No marrow edema or evidence of acute osseous abnormality.
Visualized bone marrow signal is within normal limits.

Cord: The upper thoracic spinal cord was better demonstrated on the
cervical MRI today and appear normal. The mid and lower thoracic
cord also appears to remain normal despite effaced ventral CSF space
from disc degeneration T9 through T12, detailed below. The conus
medullaris is at T12-L1 and appears normal.

Paraspinal and other soft tissues: Negative visible thoracic and
abdominal viscera. Negative thoracic paraspinal soft tissues.

Disc levels:

T1-T2: Negative.

T2-T3: Negative.

T3-T4: Negative.

T4-T5: Negative.

T5-T6: Mild if any disc bulging.  No stenosis.

T6-T7: Small disc bulge or protrusion, right paracentral. No
stenosis.

T7-T8: Negative.

T8-T9: Disc space loss and mild disc heterogeneity. Anterior disc
bulging and endplate spurring with right paracentral disc or disc
osteophyte complex (series 8, image 30). But no associated stenosis.

T9-T10: Disc space loss. Circumferential disc bulge with broad-based
posterior component. Effaced ventral CSF space but dorsal spinal
canal remains patent without significant stenosis. Mild facet
hypertrophy. No foraminal stenosis.

T10-T11: Disc space loss and mild disc heterogeneity.
Circumferential disc bulge with broad-based posterior component.
Mild facet hypertrophy. Effaced ventral CSF space but dorsal spinal
canal remains patent without significant stenosis. No foraminal
stenosis.

T11-T12: Mild disc space loss and heterogeneity. Circumferential
disc bulge with right paracentral disc or disc osteophyte complex.
Effaced ventral CSF space, but similar to the levels above no
significant stenosis.

T12-L1: Reported with the lumbar spine today.
IMPRESSION: 1. No acute osseous abnormality in the thoracic spine. Exaggerated
lower thoracic kyphosis is stable since radiographs last month with
associated disc, endplate degeneration and mild facet hypertrophy
there.
2. No significant thoracic spinal stenosis, despite broad-based disc
or disc osteophyte complex T8-T9 through T11-T12 effacing the
ventral CSF space. No foraminal stenosis.

ADDENDUM:
Furthermore, [facility] CTA Chest from [DATE] and CT
Abdomen and Pelvis [DATE] have now been identified for
comparison.

Those CTs demonstrate flowing anterior endplate osteophytes from T6
through L1. These are eccentric to the right except at the T10-T11
level which is directed toward the left.

Evidence of associated interbody ankylosis T6-T7, T8-T9, T10-T11,
T11-T12, and T12-L1. Developing ankylosis is suspected at the T7-T8
and T9-T10 levels.
CONCLUSION: Prior CTs demonstrate lower thoracic spine interbody ankylosis
through the L1 level related to flowing endplate osteophytes, except
perhaps at the T7-T8 and T9-T10 levels.

*** End of Addendum ***
FINDINGS: Limited cervical spine imaging:  Detailed separately today.

Thoracic spine segmentation:  Appears normal.

Alignment: Straightening of the thoracic spine from T1 through T9,
with exaggerated thoracic kyphosis T10 through T12 which is stable
to the radiographs last month. No scoliosis. No spondylolisthesis.

Vertebrae: No marrow edema or evidence of acute osseous abnormality.
Visualized bone marrow signal is within normal limits.

Cord: The upper thoracic spinal cord was better demonstrated on the
cervical MRI today and appear normal. The mid and lower thoracic
cord also appears to remain normal despite effaced ventral CSF space
from disc degeneration T9 through T12, detailed below. The conus
medullaris is at T12-L1 and appears normal.

Paraspinal and other soft tissues: Negative visible thoracic and
abdominal viscera. Negative thoracic paraspinal soft tissues.

Disc levels:

T1-T2: Negative.

T2-T3: Negative.

T3-T4: Negative.

T4-T5: Negative.

T5-T6: Mild if any disc bulging.  No stenosis.

T6-T7: Small disc bulge or protrusion, right paracentral. No
stenosis.

T7-T8: Negative.

T8-T9: Disc space loss and mild disc heterogeneity. Anterior disc
bulging and endplate spurring with right paracentral disc or disc
osteophyte complex (series 8, image 30). But no associated stenosis.

T9-T10: Disc space loss. Circumferential disc bulge with broad-based
posterior component. Effaced ventral CSF space but dorsal spinal
canal remains patent without significant stenosis. Mild facet
hypertrophy. No foraminal stenosis.

T10-T11: Disc space loss and mild disc heterogeneity.
Circumferential disc bulge with broad-based posterior component.
Mild facet hypertrophy. Effaced ventral CSF space but dorsal spinal
canal remains patent without significant stenosis. No foraminal
stenosis.

T11-T12: Mild disc space loss and heterogeneity. Circumferential
disc bulge with right paracentral disc or disc osteophyte complex.
Effaced ventral CSF space, but similar to the levels above no
significant stenosis.

T12-L1: Reported with the lumbar spine today.
IMPRESSION: 1. No acute osseous abnormality in the thoracic spine. Exaggerated
lower thoracic kyphosis is stable since radiographs last month with
associated disc, endplate degeneration and mild facet hypertrophy
there.
2. No significant thoracic spinal stenosis, despite broad-based disc
or disc osteophyte complex T8-T9 through T11-T12 effacing the
ventral CSF space. No foraminal stenosis.

## 2021-04-16 ENCOUNTER — Other Ambulatory Visit: Payer: 59

## 2021-07-15 ENCOUNTER — Telehealth: Payer: Self-pay

## 2021-07-15 NOTE — Telephone Encounter (Signed)
Pt called with c/o R elbow pain and swelling x 3 days. She feels a little relief when she elevates it above heart level. She has been scheduled for venous study and to see MD this week. Pt aware of these appts.If anything changes/worsens she will call us sooner.

## 2021-07-16 ENCOUNTER — Ambulatory Visit (HOSPITAL_COMMUNITY)
Admission: RE | Admit: 2021-07-16 | Discharge: 2021-07-16 | Disposition: A | Discharge status: Home / Self Care | Payer: 59 | Source: Ambulatory Visit | Attending: Vascular Surgery | Admitting: Vascular Surgery

## 2021-07-16 ENCOUNTER — Other Ambulatory Visit (HOSPITAL_COMMUNITY): Payer: Self-pay | Admitting: Vascular Surgery

## 2021-07-16 ENCOUNTER — Other Ambulatory Visit: Payer: Self-pay

## 2021-07-16 DIAGNOSIS — R600 Localized edema: Secondary | ICD-10-CM | POA: Diagnosis not present

## 2021-07-18 ENCOUNTER — Other Ambulatory Visit: Payer: Self-pay

## 2021-07-18 ENCOUNTER — Encounter: Payer: Self-pay | Admitting: Vascular Surgery

## 2021-07-18 ENCOUNTER — Ambulatory Visit (INDEPENDENT_AMBULATORY_CARE_PROVIDER_SITE_OTHER): Payer: 59 | Admitting: Vascular Surgery

## 2021-07-18 VITALS — BP 112/82 | HR 85 | Temp 97.7°F | Resp 20 | Ht 66.0 in | Wt 252.0 lb

## 2021-07-18 DIAGNOSIS — S55101D Unspecified injury of radial artery at forearm level, right arm, subsequent encounter: Secondary | ICD-10-CM | POA: Diagnosis not present

## 2021-07-18 NOTE — Progress Notes (Signed)
POST OPERATIVE OFFICE NOTE    CC:  Transient right forearm swelling  HPI:  This is a 45 y.o. female who is s/p thrombectomy of right radial artery with patch angioplasty to right radial artery on 12/16/2020 by Dr. Darrick Penna after cardiac cath on 12/10/2020.  Pt had some electrical type pains in forearm day of cath.  She has had continued numbness of the entire right hand and weakness. Embolectomy did not improve her sxs.  She has continued to work with occupational therapy however, she is only gained flexion of the fifth digit.  She presents today at the request of her PCP after new onset swelling in the right forearm.  Patient just returned from her honeymoon in [place] when she appreciated right forearm swelling.  This did not improve over the course of Sunday, therefore she called her primary care, who recommended she follow-up with vascular surgery.  Over the last few days, the swelling is dissipated.  She continues to have pleat sensory loss below the forearm, and no motor except for flexion of the fifth digit.  She is unaware of an inciting event for the swelling episode, but wanted to ensure she had intact arterial flow.   Allergies  Allergen Reactions   Coconut Flavor Anaphylaxis and Swelling   Pineapple Anaphylaxis and Swelling    Current Outpatient Medications  Medication Sig Dispense Refill   acetaminophen (TYLENOL) 325 MG tablet Take 650 mg by mouth every 6 (six) hours as needed for mild pain or headache.     albuterol (VENTOLIN HFA) 108 (90 Base) MCG/ACT inhaler Inhale 2 puffs into the lungs every 6 (six) hours as needed for wheezing.     aspirin EC 81 MG tablet Take 1 tablet (81 mg total) by mouth daily. Swallow whole. 90 tablet 3   DULoxetine (CYMBALTA) 60 MG capsule Take 60 mg by mouth every morning.     gabapentin (NEURONTIN) 400 MG capsule Take 400 mg by mouth at bedtime.     hydrochlorothiazide (HYDRODIURIL) 25 MG tablet Take 25 mg by mouth daily.     lisinopril (ZESTRIL)  10 MG tablet Take 10 mg by mouth daily.     meclizine (ANTIVERT) 25 MG tablet Take 12.5-25 mg by mouth 3 (three) times daily as needed.     Multiple Vitamins-Minerals (MULTIVITAMIN WITH MINERALS) tablet Take 1 tablet by mouth daily.     nitroGLYCERIN (NITROSTAT) 0.4 MG SL tablet Place 0.4 mg under the tongue every 5 (five) minutes as needed for chest pain.     omeprazole (PRILOSEC) 20 MG capsule Take 20 mg by mouth daily.     tiZANidine (ZANAFLEX) 4 MG tablet Take 4 mg by mouth every 6 (six) hours as needed.     traZODone (DESYREL) 50 MG tablet Take 50 mg by mouth at bedtime.     No current facility-administered medications for this visit.     ROS:  See HPI  Physical Exam:  Today's Vitals   07/18/21 0826  BP: 112/82  Pulse: 85  Resp: 20  Temp: 97.7 F (36.5 C)  SpO2: 97%  Weight: 252 lb (114.3 kg)  Height: 5\' 6"  (1.676 m)  PainSc: 5    Body mass index is 40.67 kg/m.   Physical Exam Constitutional:      Appearance: Normal appearance.  HENT:     Head: Normocephalic and atraumatic.     Nose: No congestion.  Eyes:     General: No scleral icterus.    Pupils: Pupils are equal,  round, and reactive to light.  Cardiovascular:     Rate and Rhythm: Normal rate.     Comments: Right hand - radial and ulnar signals, palmar arch signal and digit signals in the middle finger Abdominal:     General: There is no distension.     Tenderness: There is no abdominal tenderness.  Musculoskeletal:        General: No swelling.     Cervical back: Normal range of motion. No tenderness.     Comments: No sensory or motor in the right hand except for mild flexion at the 5th digit  Skin:    General: Skin is warm and dry.  Neurological:     General: No focal deficit present.     Mental Status: She is alert and oriented to person, place, and time.  Psychiatric:        Mood and Affect: Mood normal.        Behavior: Behavior normal.     Comments: optimistic       Assessment/Plan:  This is  a 45 y.o. female who is s/p thrombectomy of right radial artery with patch angioplasty to right radial artery on 12/16/2020 by Dr. Darrick Penna after cardiac catheterization.   Patient's right upper extremity ultrasound reviewed.  No DVT. Biphasic signals at the wrist (ulnar and radial), hand and digital signal appreciated. Patient continues to work with occupational therapy for rehab, but has had minimal improvement. No concern for arterial thrombosis at this time I told Conita, we will always be here for her, and she can come see [place] with any concerns she may have. She can follow-up as needed. Continue ASA 81 mg.   [facility] [phone]

## 2021-07-18 NOTE — Addendum Note (Signed)
Addended by: Gerarda Fraction E on: 07/18/2021 10:31 AM   Modules accepted: Level of Service

## 2022-02-04 ENCOUNTER — Emergency Department (HOSPITAL_BASED_OUTPATIENT_CLINIC_OR_DEPARTMENT_OTHER)
Admission: EM | Admit: 2022-02-04 | Discharge: 2022-02-04 | Disposition: A | Discharge status: Home / Self Care | Payer: Self-pay | Attending: Emergency Medicine | Admitting: Emergency Medicine

## 2022-02-04 ENCOUNTER — Emergency Department (HOSPITAL_BASED_OUTPATIENT_CLINIC_OR_DEPARTMENT_OTHER): Payer: Self-pay

## 2022-02-04 ENCOUNTER — Encounter (HOSPITAL_BASED_OUTPATIENT_CLINIC_OR_DEPARTMENT_OTHER): Payer: Self-pay

## 2022-02-04 ENCOUNTER — Other Ambulatory Visit: Payer: Self-pay

## 2022-02-04 DIAGNOSIS — W228XXA Striking against or struck by other objects, initial encounter: Secondary | ICD-10-CM | POA: Insufficient documentation

## 2022-02-04 DIAGNOSIS — Y990 Civilian activity done for income or pay: Secondary | ICD-10-CM | POA: Insufficient documentation

## 2022-02-04 DIAGNOSIS — Z7982 Long term (current) use of aspirin: Secondary | ICD-10-CM | POA: Insufficient documentation

## 2022-02-04 DIAGNOSIS — S4992XA Unspecified injury of left shoulder and upper arm, initial encounter: Secondary | ICD-10-CM

## 2022-02-04 DIAGNOSIS — S5012XA Contusion of left forearm, initial encounter: Secondary | ICD-10-CM | POA: Insufficient documentation

## 2022-02-04 HISTORY — DX: Type 2 diabetes mellitus without complications: E11.9

## 2022-02-04 HISTORY — DX: Essential (primary) hypertension: I10

## 2022-02-04 HISTORY — DX: Unspecified kyphosis, site unspecified: M40.209

## 2022-02-04 HISTORY — DX: Rheumatoid arthritis, unspecified: M06.9

## 2022-02-04 IMAGING — DX DG FOREARM 2V*L*
2 series · 2 of 2 positions shown · non-contrast
Comparison: None.

CLINICAL DATA: Left arm swelling after injury.

EXAM:
LEFT FOREARM - 2 VIEW

[forearm ap]
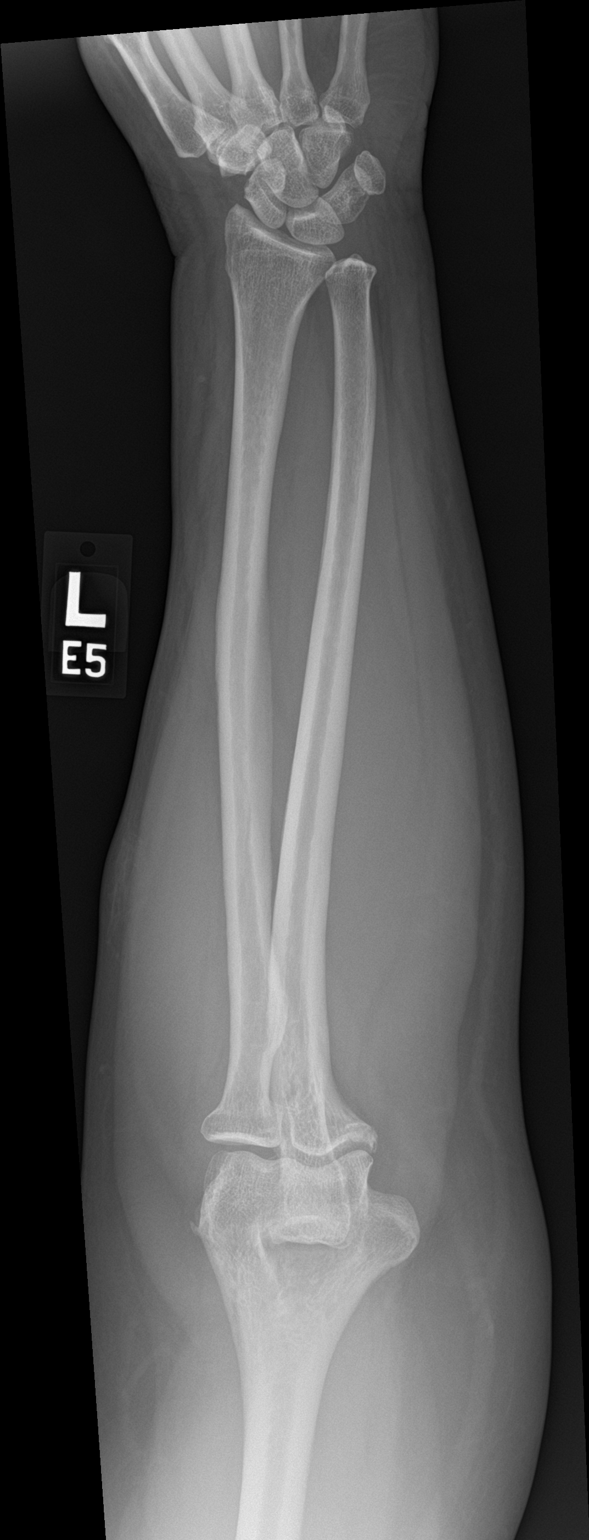

[forearm lat]
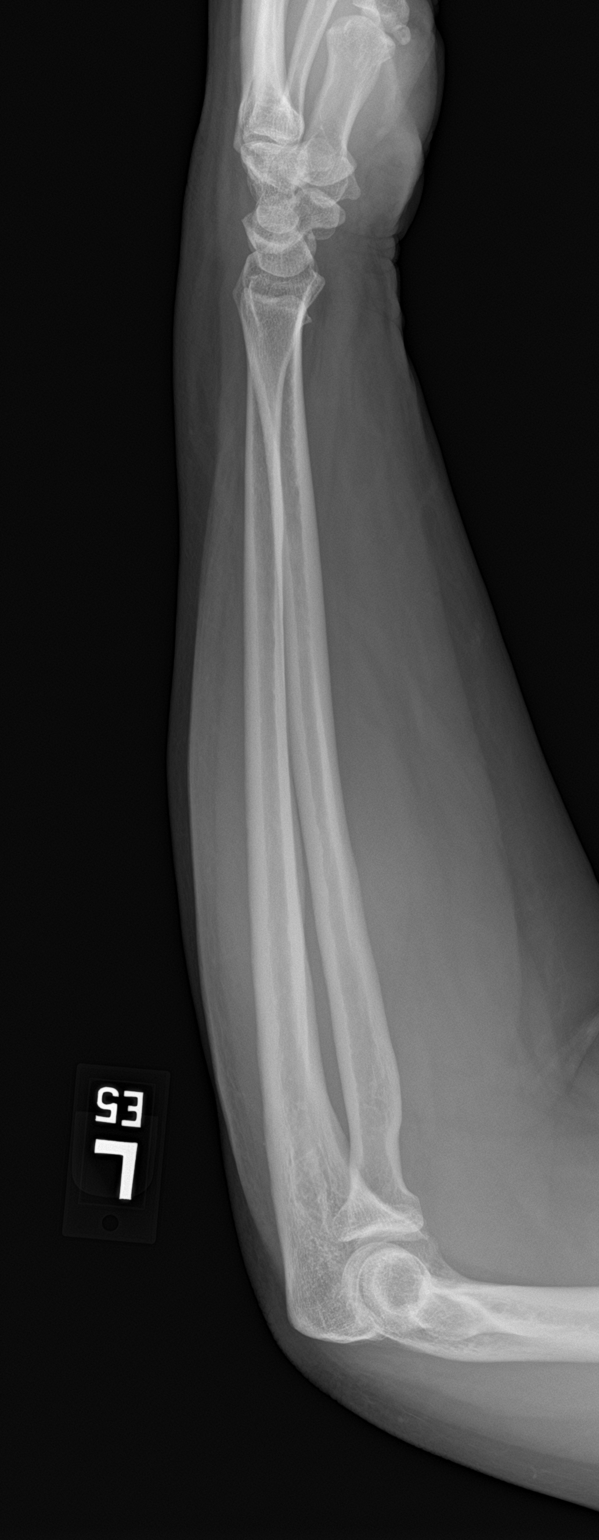

[2 of 2 positions shown; findings below may reference images not displayed]

FINDINGS: There is no evidence of fracture or other focal bone lesions. Soft
tissues are unremarkable.
IMPRESSION: Negative.

## 2022-02-04 NOTE — Discharge Instructions (Addendum)
Take the medicine prescribed by your doctor.  You can ice the area that is hurting at rest, return to ED if things change or worsen.  Follow-up with your primary for symptoms persist. ?

## 2022-02-04 NOTE — ED Triage Notes (Signed)
States hit her left forearm on a lock, had severe pain and now swelling to upper forearm. States pain when moving arm, numbness when straightening arm. ?

## 2022-02-04 NOTE — ED Provider Notes (Signed)
?[facility] ?Provider Note ? ? ?CSN: [id number] ?Arrival date & time: 02/04/22  1811 ? ?  ? ?History ? ?Chief Complaint  ?Patient presents with  ? Arm Injury  ? ? ?Sarah Rocha is a 46 y.o. female. ? ? ?Arm Injury ? ?Patient with medical history notable for RA on methotrexate presents today due to arm injury.  Patient was at work earlier, she hit her left forearm against the lock on the door and has been having pain and some localized swelling since then.  He has tried icing with some alleviation of pain.  Denies any elbow pain, no wrist pain.  States she felt numb which she describes as a tingling sensation.  Pain is worse with any pressure over the hematoma to left forearm. ? ?Home Medications ?Prior to Admission medications   ?Medication Sig Start Date End Date Taking? Authorizing Provider  ?acetaminophen (TYLENOL) 325 MG tablet Take 650 mg by mouth every 6 (six) hours as needed for mild pain or headache.    [provider]  ?albuterol (VENTOLIN HFA) 108 (90 Base) MCG/ACT inhaler Inhale 2 puffs into the lungs every 6 (six) hours as needed for wheezing.    [provider]  ?aspirin EC 81 MG tablet Take 1 tablet (81 mg total) by mouth daily. Swallow whole. 12/04/20   Revankar, Reita Cliche, MD  ?DULoxetine (CYMBALTA) 60 MG capsule Take 60 mg by mouth every morning. 07/02/21   [provider]  ?gabapentin (NEURONTIN) 400 MG capsule Take 400 mg by mouth at bedtime.    [provider]  ?hydrochlorothiazide (HYDRODIURIL) 25 MG tablet Take 25 mg by mouth daily. 06/05/21   [provider]  ?lisinopril (ZESTRIL) 10 MG tablet Take 10 mg by mouth daily. 07/02/21   [provider]  ?meclizine (ANTIVERT) 25 MG tablet Take 12.5-25 mg by mouth 3 (three) times daily as needed. 06/05/21   [provider]  ?Multiple Vitamins-Minerals (MULTIVITAMIN WITH MINERALS) tablet Take 1 tablet by mouth daily.    [provider]  ?nitroGLYCERIN  (NITROSTAT) 0.4 MG SL tablet Place 0.4 mg under the tongue every 5 (five) minutes as needed for chest pain.    [provider]  ?omeprazole (PRILOSEC) 20 MG capsule Take 20 mg by mouth daily.    [provider]  ?tiZANidine (ZANAFLEX) 4 MG tablet Take 4 mg by mouth every 6 (six) hours as needed. 05/14/21   [provider]  ?traZODone (DESYREL) 50 MG tablet Take 50 mg by mouth at bedtime. 06/05/21   [provider]  ?   ? ?Allergies    ?Coconut flavor and Pineapple   ? ?Review of Systems   ?Review of Systems ? ?Physical Exam ?Updated Vital Signs ?BP 128/62 (BP Location: Left Arm)   Pulse (!) 112   Temp 98.3 ?F (36.8 ?C) (Oral)   Resp 20   Ht 5\' 6"  (1.676 m)   Wt 105.2 kg   LMP 08/07/2020   SpO2 98%   BMI 37.45 kg/m?  ?Physical Exam ?Vitals and nursing note reviewed. Exam conducted with a chaperone present.  ?Constitutional:   ?   General: She is not in acute distress. ?   Appearance: Normal appearance.  ?HENT:  ?   Head: Normocephalic and atraumatic.  ?Eyes:  ?   General: No scleral icterus. ?   Extraocular Movements: Extraocular movements intact.  ?   Pupils: Pupils are equal, round, and reactive to light.  ?Cardiovascular:  ?   Pulses: Normal  pulses.  ?Musculoskeletal:     ?   General: Tenderness present. Normal range of motion.  ?   Comments: Hematoma to anterior left forearm, no crepitus.  Full ROM to left wrist, left elbow  ?Skin: ?   Capillary Refill: Capillary refill takes less than 2 seconds.  ?   Coloration: Skin is not jaundiced.  ?Neurological:  ?   Mental Status: She is alert. Mental status is at baseline.  ?   Coordination: Coordination normal.  ? ? ?ED Results / Procedures / Treatments   ?Labs ?(all labs ordered are listed, but only abnormal results are displayed) ?Labs Reviewed - No data to display ? ?EKG ?None ? ?Radiology ?DG Forearm Left ? ?Result Date: 02/04/2022 ?CLINICAL DATA:  Left arm swelling after injury. EXAM: LEFT FOREARM - 2 VIEW COMPARISON:  None.  FINDINGS: There is no evidence of fracture or other focal bone lesions. Soft tissues are unremarkable. IMPRESSION: Negative. Electronically Signed   By: Marijo Conception M.D.   On: 02/04/2022 18:42   ? ?Procedures ?Procedures  ? ? ?Medications Ordered in ED ?Medications - No data to display ? ?ED Course/ Medical Decision Making/ A&P ?  ?                        ?Medical Decision Making ?Amount and/or Complexity of Data Reviewed ?Radiology: ordered. ? ? ?This is a 46 year old female presenting today due to left arm pain.  Happened after hitting her arm against the LOC, x-rays were ordered, viewed and interpreted by myself.  Not see any evidence of an acute fracture or dislocation.  She is neurovascularly intact, brisk cap refill and radial pulses 2+.  Good ROM to wrist and elbow, able to make a fist.  Upper extremities roughly symmetric bilaterally, suspect muscle pain and do not think she needs any additional work-up at this time.   ? ? ? ? ? ? ? ?Final Clinical Impression(s) / ED Diagnoses ?Final diagnoses:  ?None  ? ? ?Rx / DC Orders ?ED Discharge Orders   ? ? None  ? ?  ? ? ?  ?Sherrill Raring, PA-C ?02/04/22 1857 ? ?  ?Drenda Freeze, MD ?02/04/22 2116 ? ?

## 2022-06-08 ENCOUNTER — Emergency Department (HOSPITAL_BASED_OUTPATIENT_CLINIC_OR_DEPARTMENT_OTHER)
Admission: EM | Admit: 2022-06-08 | Discharge: 2022-06-08 | Disposition: A | Discharge status: Home / Self Care | Payer: Self-pay | Attending: Emergency Medicine | Admitting: Emergency Medicine

## 2022-06-08 ENCOUNTER — Encounter (HOSPITAL_BASED_OUTPATIENT_CLINIC_OR_DEPARTMENT_OTHER): Payer: Self-pay | Admitting: Pediatrics

## 2022-06-08 ENCOUNTER — Other Ambulatory Visit: Payer: Self-pay

## 2022-06-08 DIAGNOSIS — T7840XA Allergy, unspecified, initial encounter: Secondary | ICD-10-CM

## 2022-06-08 DIAGNOSIS — Z7982 Long term (current) use of aspirin: Secondary | ICD-10-CM | POA: Insufficient documentation

## 2022-06-08 DIAGNOSIS — J45909 Unspecified asthma, uncomplicated: Secondary | ICD-10-CM | POA: Insufficient documentation

## 2022-06-08 DIAGNOSIS — Z79899 Other long term (current) drug therapy: Secondary | ICD-10-CM | POA: Insufficient documentation

## 2022-06-08 DIAGNOSIS — L500 Allergic urticaria: Secondary | ICD-10-CM | POA: Insufficient documentation

## 2022-06-08 LAB — PREGNANCY, URINE: Preg Test, Ur: NEGATIVE

## 2022-06-08 MED ORDER — METHYLPREDNISOLONE SODIUM SUCC 125 MG IJ SOLR
125.0000 mg | Freq: Once | INTRAMUSCULAR | Status: AC
  Administered 2022-06-08: 125 mg via INTRAVENOUS
  Filled 2022-06-08: qty 2

## 2022-06-08 MED ORDER — EPINEPHRINE 0.3 MG/0.3ML IJ SOAJ
0.3000 mg | Freq: Once | INTRAMUSCULAR | Status: DC
  Filled 2022-06-08: qty 0.3

## 2022-06-08 MED ORDER — SODIUM CHLORIDE 0.9 % IV BOLUS
1000.0000 mL | Freq: Once | INTRAVENOUS | Status: AC
  Administered 2022-06-08: 1000 mL via INTRAVENOUS

## 2022-06-08 MED ORDER — SODIUM CHLORIDE 0.9 % IV SOLN: INTRAVENOUS | Status: DC

## 2022-06-08 MED ORDER — DIPHENHYDRAMINE HCL 50 MG/ML IJ SOLN
25.0000 mg | Freq: Once | INTRAMUSCULAR | Status: AC
  Administered 2022-06-08: 25 mg via INTRAVENOUS
  Filled 2022-06-08: qty 1

## 2022-06-08 MED ORDER — FAMOTIDINE IN NACL 20-0.9 MG/50ML-% IV SOLN
20.0000 mg | Freq: Once | INTRAVENOUS | Status: AC
  Administered 2022-06-08: 20 mg via INTRAVENOUS
  Filled 2022-06-08: qty 50

## 2022-06-08 MED ORDER — EPINEPHRINE 0.3 MG/0.3ML IJ SOAJ: 0.3000 mg | INTRAMUSCULAR | 0 refills | Status: AC | PRN

## 2022-06-08 NOTE — ED Notes (Signed)
Patient reported taking benadryl by mouth x 1

## 2022-06-08 NOTE — ED Notes (Signed)
ED Provider at bedside. 

## 2022-06-08 NOTE — Discharge Instructions (Addendum)
You were seen today in the emergency department for allergic reaction.  You are given this aggressive steroids, Pepcid and Benadryl.  Please avoid any shrimp products in the future.  Follow-up with your primary as needed for allergy testing.  Use the epinephrine pen if you have a severe anaphylactic reaction, as we discussed if you use the pen you will need to call EMS and be seen in the emergency department.  Take Benadryl for any itching or hives that may develop.  Return to the ED if you have any inability to breathe, swallow or new concerning symptoms.

## 2022-06-08 NOTE — ED Notes (Signed)
Resting quietly with eyes closed, door remains open for constant observation of client.  Rash/ Redness/ [place] on rt side of face noted to have major improvement, less noted at this time.  Client voices no complaints at this time.

## 2022-06-08 NOTE — ED Provider Notes (Signed)
[facility] EMERGENCY DEPARTMENT Provider Note   CSN: [id number] Arrival date & time: 06/08/22  1511     History  Chief Complaint  Patient presents with   Allergic Reaction    Sarah Rocha is a 46 y.o. female.   Allergic Reaction    Patient with medical history of morbid obesity, allergies, asthma, history of anaphylaxis presents today due to a possible allergic reaction.  Patient states about 30 minutes prior to arrival she was eating shrimp at a restaurant.  She started feeling itching and having hives across her throat.  She feels like there is a tickling in the back of her throat, denies any trouble swallowing, speaking or breathing.  Not having any nausea or vomiting, denies any chest pain or feeling short of breath.  She took a single pill of Benadryl prior to arrival, no EpiPen usage yet.  Home Medications Prior to Admission medications   Medication Sig Start Date End Date Taking? Authorizing Provider  EPINEPHrine 0.3 mg/0.3 mL IJ SOAJ injection Inject 0.3 mg into the muscle as needed for anaphylaxis. 06/08/22  Yes Theron Arista, PA-C  acetaminophen (TYLENOL) 325 MG tablet Take 650 mg by mouth every 6 (six) hours as needed for mild pain or headache.    [provider]  albuterol (VENTOLIN HFA) 108 (90 Base) MCG/ACT inhaler Inhale 2 puffs into the lungs every 6 (six) hours as needed for wheezing.    [provider]  aspirin EC 81 MG tablet Take 1 tablet (81 mg total) by mouth daily. Swallow whole. 12/04/20   Revankar, Aundra Dubin, MD  DULoxetine (CYMBALTA) 60 MG capsule Take 60 mg by mouth every morning. 07/02/21   [provider]  gabapentin (NEURONTIN) 400 MG capsule Take 400 mg by mouth at bedtime.    [provider]  hydrochlorothiazide (HYDRODIURIL) 25 MG tablet Take 25 mg by mouth daily. 06/05/21   [provider]  lisinopril (ZESTRIL) 10 MG tablet Take 10 mg by mouth daily. 07/02/21   [provider]  meclizine  (ANTIVERT) 25 MG tablet Take 12.5-25 mg by mouth 3 (three) times daily as needed. 06/05/21   [provider]  Multiple Vitamins-Minerals (MULTIVITAMIN WITH MINERALS) tablet Take 1 tablet by mouth daily.    [provider]  nitroGLYCERIN (NITROSTAT) 0.4 MG SL tablet Place 0.4 mg under the tongue every 5 (five) minutes as needed for chest pain.    [provider]  omeprazole (PRILOSEC) 20 MG capsule Take 20 mg by mouth daily.    [provider]  tiZANidine (ZANAFLEX) 4 MG tablet Take 4 mg by mouth every 6 (six) hours as needed. 05/14/21   [provider]  traZODone (DESYREL) 50 MG tablet Take 50 mg by mouth at bedtime. 06/05/21   [provider]      Allergies    Coconut flavor and Pineapple    Review of Systems   Review of Systems  Physical Exam Updated Vital Signs BP 132/77 (BP Location: Left Arm)   Pulse 78   Temp 99.1 F (37.3 C) (Oral)   Resp 18   Ht 5\' 5"  (1.651 m)   Wt 108.9 kg   LMP 08/07/2020   SpO2 100%   BMI 39.94 kg/m  Physical Exam Vitals and nursing note reviewed. Exam conducted with a chaperone present.  Constitutional:      Appearance: Normal appearance.  HENT:     Head: Normocephalic and atraumatic.     Mouth/Throat:     Comments: Mallampati  4.  Uvula is midline. Eyes:     General: No scleral icterus.       Right eye: No discharge.        Left eye: No discharge.     Extraocular Movements: Extraocular movements intact.     Pupils: Pupils are equal, round, and reactive to light.  Cardiovascular:     Rate and Rhythm: Normal rate and regular rhythm.     Pulses: Normal pulses.     Heart sounds: Normal heart sounds. No murmur heard.    No friction rub. No gallop.  Pulmonary:     Effort: Pulmonary effort is normal. No respiratory distress.     Breath sounds: Normal breath sounds.  Abdominal:     General: Abdomen is flat. Bowel sounds are normal. There is no distension.     Palpations: Abdomen is soft.      Tenderness: There is no abdominal tenderness.  Skin:    General: Skin is warm and dry.     Coloration: Skin is not jaundiced.     Comments: Urticarial lesions on face, arms, torso  Neurological:     Mental Status: She is alert. Mental status is at baseline.     Coordination: Coordination normal.     ED Results / Procedures / Treatments   Labs (all labs ordered are listed, but only abnormal results are displayed) Labs Reviewed  PREGNANCY, URINE    EKG None  Radiology No results found.  Procedures Procedures    Medications Ordered in ED Medications  diphenhydrAMINE (BENADRYL) injection 25 mg (25 mg Intravenous Given 06/08/22 1542)  methylPREDNISolone sodium succinate (SOLU-MEDROL) 125 mg/2 mL injection 125 mg (125 mg Intravenous Given 06/08/22 1542)  famotidine (PEPCID) IVPB 20 mg premix (0 mg Intravenous Stopped 06/08/22 1545)  sodium chloride 0.9 % bolus 1,000 mL (0 mLs Intravenous Stopped 06/08/22 1651)    ED Course/ Medical Decision Making/ A&P Clinical Course as of 06/09/22 0006  Mon Jun 08, 2022  1557 I did reassess the patient, she is feeling significantly improved.  The itching is resolved, she denies any swelling in her throat, tickling in the back of her throat, difficulty swallowing.  States she just feels tired.  We will continue to monitor [HS]    Clinical Course User Index [HS] Theron Arista, PA-C                           Medical Decision Making Amount and/or Complexity of Data Reviewed Labs: ordered.  Risk Prescription drug management.   Patient presents due to allergic reaction.  Differential includes but limited to allergic reaction, anaphylaxis, angioedema.  On exam patient has urticaria, no stridor and no uvular swelling.  Normal phonation, no angioedema.  Symptoms most consistent with simple allergic reaction given no to body system involvement.    Initially she describes a tickling in the back of her throat, I ordered Benadryl, Pepcid, steroids  were administered.  She was reassessed and shortly after and felt significantly improved.  Monitored in the ED with frequently evaluation for over an hour without progression of symptoms.   Epinephrine was considered but ultimately not felt indicated given does not appear consistent with anaphylaxis.    Patient has a history of diabetes so will not send home with steroid burst pack.  Antihistamines advised, return precaution discussed.  We discussed using EpiPen and I sent one to her pharmacy in case she does have anaphylaxis in the future.  She verbalized  understanding that if she uses the pen she will need to call EMS and be seen in the emergency department.    Monitor      Final Clinical Impression(s) / ED Diagnoses Final diagnoses:  Allergic reaction, initial encounter    Rx / DC Orders ED Discharge Orders          Ordered    EPINEPHrine 0.3 mg/0.3 mL IJ SOAJ injection  As needed        06/08/22 1722              Theron Arista, PA-C 06/09/22 0006    Rolan Bucco, MD 06/17/22 [phone]

## 2022-06-08 NOTE — ED Notes (Signed)
Ready for DC to home, no redness or swelling noted on face is noted, speech WNL, swallowing WNL. Voices no complaints. VSS

## 2022-06-08 NOTE — ED Notes (Signed)
Presents with facial itching, noted to have raised whelp on rt side of face, states her tongue is swelling, speech WNL, able to swallow, able to control sec, tracheal sounds clear, bilateral BS are clear, resp even and non-labored. NSR noted on monitor, VSS currently, GCS 15

## 2022-06-08 NOTE — ED Triage Notes (Signed)
Reported eaten lunch ([employer]) 30 mins ago with nothing new that she know of. C/O itching in tongue, feels like face is swelling up; ambulatory in triage and able to speak full sentences. 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

## 2022-07-07 ENCOUNTER — Other Ambulatory Visit: Payer: Self-pay | Admitting: Physician Assistant

## 2022-07-07 DIAGNOSIS — Z1231 Encounter for screening mammogram for malignant neoplasm of breast: Secondary | ICD-10-CM

## 2022-07-08 ENCOUNTER — Other Ambulatory Visit: Payer: Self-pay | Admitting: Physician Assistant

## 2022-07-08 DIAGNOSIS — R591 Generalized enlarged lymph nodes: Secondary | ICD-10-CM

## 2022-07-08 DIAGNOSIS — R590 Localized enlarged lymph nodes: Secondary | ICD-10-CM

## 2022-08-04 ENCOUNTER — Other Ambulatory Visit: Payer: Self-pay | Admitting: Physician Assistant

## 2022-08-04 DIAGNOSIS — S22000A Wedge compression fracture of unspecified thoracic vertebra, initial encounter for closed fracture: Secondary | ICD-10-CM

## 2022-08-05 ENCOUNTER — Other Ambulatory Visit: Payer: Self-pay

## 2022-08-07 ENCOUNTER — Ambulatory Visit: Payer: Self-pay

## 2022-09-15 ENCOUNTER — Ambulatory Visit: Payer: Self-pay

## 2022-09-30 ENCOUNTER — Ambulatory Visit: Payer: Commercial Managed Care - HMO | Admitting: Orthopaedic Surgery

## 2022-09-30 ENCOUNTER — Ambulatory Visit (INDEPENDENT_AMBULATORY_CARE_PROVIDER_SITE_OTHER): Payer: Commercial Managed Care - HMO

## 2022-09-30 ENCOUNTER — Ambulatory Visit: Payer: Self-pay

## 2022-09-30 ENCOUNTER — Encounter: Payer: Self-pay | Admitting: Orthopaedic Surgery

## 2022-09-30 VITALS — Ht 65.0 in | Wt 249.0 lb

## 2022-09-30 DIAGNOSIS — G8929 Other chronic pain: Secondary | ICD-10-CM | POA: Diagnosis not present

## 2022-09-30 DIAGNOSIS — M25562 Pain in left knee: Secondary | ICD-10-CM | POA: Diagnosis not present

## 2022-09-30 DIAGNOSIS — Z6841 Body Mass Index (BMI) 40.0 and over, adult: Secondary | ICD-10-CM

## 2022-09-30 DIAGNOSIS — M25561 Pain in right knee: Secondary | ICD-10-CM

## 2022-09-30 MED ORDER — DICLOFENAC SODIUM 75 MG PO TBEC: 75.0000 mg | DELAYED_RELEASE_TABLET | Freq: Two times a day (BID) | ORAL | 2 refills | Status: AC

## 2022-09-30 NOTE — Progress Notes (Signed)
Office Visit Note   Patient: Sarah Rocha           Date of Birth: Jun 04, 1976           MRN: [id number] Visit Date: 09/30/2022              Requested by: Yvonne Kendall, NP [address] [place],  [place] [address] PCP: Yvonne Kendall, NP   Assessment & Plan: Visit Diagnoses:  1. Chronic pain of both knees   2. Body mass index 40.0-44.9, adult (HCC)     Plan: Examination is bilateral knee osteoarthritis.  She has not had significant relief from injections.  Her joint spaces are well-preserved still want to get MRIs to make sure that there is not any other pathology causing pain.  Patient is status post gastric sleeve surgery and has lost a tremendous amount of weight since surgery.  For now we will send in prescription for oral diclofenac and have given her recommendation for Voltaren gel.  Follow-up after the MRIs.  Follow-Up Instructions: No follow-ups on file.   Orders:  Orders Placed This Encounter  Procedures   XR KNEE 3 VIEW LEFT   XR KNEE 3 VIEW RIGHT   Meds ordered this encounter  Medications   diclofenac (VOLTAREN) 75 MG EC tablet    Sig: Take 1 tablet (75 mg total) by mouth 2 (two) times daily.    Dispense:  30 tablet    Refill:  2      Procedures: No procedures performed   Clinical Data: No additional findings.   Subjective: Chief Complaint  Patient presents with   Right Knee - Pain   Left Knee - Pain    HPI Sarah Rocha is a 46 year old female here for evaluation of chronic bilateral knee pain worse on the left.  She recently moved to [place] couple years ago from [place].  She has had chronic issues with her knees.  In [place] she had received cortisone and Visco injections which only provided a couple weeks of relief.  She feels that her left knee gives way and there is a knot behind the knee.  She endorses catching and locking.  She has constant throbbing aching pain. Review of Systems  Constitutional: Negative.   HENT: Negative.    Eyes:  Negative.   Respiratory: Negative.    Cardiovascular: Negative.   Endocrine: Negative.   Musculoskeletal: Negative.   Neurological: Negative.   Hematological: Negative.   Psychiatric/Behavioral: Negative.    All other systems reviewed and are negative.    Objective: Vital Signs: Ht 5\' 5"  (1.651 m)   Wt 249 lb (112.9 kg)   LMP 08/07/2020   BMI 41.44 kg/m   Physical Exam Vitals and nursing note reviewed.  Constitutional:      Appearance: She is well-developed.  HENT:     Head: Atraumatic.     Nose: Nose normal.  Eyes:     Extraocular Movements: Extraocular movements intact.  Cardiovascular:     Pulses: Normal pulses.  Pulmonary:     Effort: Pulmonary effort is normal.  Abdominal:     Palpations: Abdomen is soft.  Musculoskeletal:     Cervical back: Neck supple.  Skin:    General: Skin is warm.     Capillary Refill: Capillary refill takes less than 2 seconds.  Neurological:     Mental Status: She is alert. Mental status is at baseline.  Psychiatric:        Behavior: Behavior normal.  Thought Content: Thought content normal.        Judgment: Judgment normal.     Ortho Exam Exam nation of bilateral knees show no joint effusion.  Slight joint line tenderness.  1+ patellofemoral crepitus with range of motion.  Collaterals and cruciates are stable. Specialty Comments:  No specialty comments available.  Imaging: XR KNEE 3 VIEW RIGHT  Result Date: 09/30/2022 Three-view x-rays of the right knee shows moderate tricompartmental osteoarthritis.  Well-preserved joint spaces.  Periarticular spurring is present.  XR KNEE 3 VIEW LEFT  Result Date: 09/30/2022 Three-view x-rays of the left knee shows moderate tricompartmental arthritis.  Joint spaces are preserved.  Periarticular spurring is present.    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   Chest  pain of uncertain etiology 12/04/2020   Abdominal hernia    Abnormal uterine and vaginal bleeding, unspecified    Acute bronchitis, unspecified    Chest pain on breathing    Lightheadedness    Morbid obesity (HCC)    Neuropathy    Shortness of breath    Syncope and collapse    Vitamin D deficiency    Past Medical History:  Diagnosis Date   Abdominal hernia    Abnormal uterine and vaginal bleeding, unspecified    Acute bronchitis, unspecified    Chest pain of uncertain etiology 12/04/2020   Chest pain on breathing    Complication of anesthesia    hard to wake   Diabetes mellitus without complication (HCC)    Hypertension    Kyphosis    Lightheadedness    Morbid obesity (HCC)    Neuropathy    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   Rheumatoid arthritis (HCC)    Shortness of breath    Syncope and collapse    Vitamin D deficiency     Family History  Problem Relation Age of Onset   Diabetes Mother    Heart attack Mother    Diabetes Father    Heart attack Father    Hypertension Father     Past Surgical History:  Procedure Laterality Date   APPENDECTOMY     CESAREAN SECTION     CHOLECYSTECTOMY     gastric bypass sleeve     HERNIA REPAIR     LEFT HEART CATH AND CORONARY ANGIOGRAPHY N/A 12/10/2020   Procedure: LEFT HEART CATH AND CORONARY ANGIOGRAPHY;  Surgeon: Lennette Bihari, MD;  Location: [facility];  Service: Cardiovascular;  Laterality: N/A;   PATCH ANGIOPLASTY Right 12/16/2020   Procedure: PATCH ANGIOPLASTY RIGHT RADIAL ARTERY USING XENOSURE BIOLOGIC PATCH;  Surgeon: Sherren Kerns, MD;  Location: [facility];  Service: Vascular;  Laterality: Right;   SKIN TAG REMOVAL     THROMBECTOMY AND REVISION OF ARTERIOVENTOUS (AV) GORETEX  GRAFT Right 12/16/2020   Procedure: EXPLORATION RIGHT RADIAL ARTEY THROMBECTOMY;  Surgeon: Sherren Kerns, MD;  Location: [facility];  Service: Vascular;  Laterality: Right;   TONSILLECTOMY     Social History   Occupational History   Not on  file  Tobacco Use   Smoking status: Former   Smokeless tobacco: Never  Vaping Use   Vaping Use: Never used  Substance and Sexual Activity   Alcohol use: Never   Drug use: Never   Sexual activity: Not on file

## 2022-09-30 NOTE — Addendum Note (Signed)
Addended by: Wendi Maya on: 09/30/2022 01:36 PM   Modules accepted: Orders

## 2022-10-12 ENCOUNTER — Telehealth: Payer: Self-pay | Admitting: *Deleted

## 2022-10-12 NOTE — Telephone Encounter (Signed)
Patient called requesting an appt. She states she still has continued numbness RUE and it turns cold and blue at times. Patient offered appt 10/16/22 but states she will be out of town. Scheduled patient for RUE arterial duplex and appt with Dr Karin Lieu on 10/23/22.

## 2022-10-13 ENCOUNTER — Other Ambulatory Visit: Payer: Self-pay | Admitting: *Deleted

## 2022-10-13 DIAGNOSIS — S55101D Unspecified injury of radial artery at forearm level, right arm, subsequent encounter: Secondary | ICD-10-CM

## 2022-10-22 ENCOUNTER — Other Ambulatory Visit: Payer: Commercial Managed Care - HMO

## 2022-10-23 ENCOUNTER — Ambulatory Visit: Payer: Commercial Managed Care - HMO | Admitting: Vascular Surgery

## 2022-10-23 ENCOUNTER — Encounter: Payer: Self-pay | Admitting: Vascular Surgery

## 2022-10-23 ENCOUNTER — Ambulatory Visit (HOSPITAL_COMMUNITY)
Admission: RE | Admit: 2022-10-23 | Discharge: 2022-10-23 | Disposition: A | Discharge status: Home / Self Care | Payer: Commercial Managed Care - HMO | Source: Ambulatory Visit | Attending: Vascular Surgery | Admitting: Vascular Surgery

## 2022-10-23 VITALS — BP 139/79 | HR 71 | Temp 98.2°F | Resp 20 | Ht 65.0 in | Wt 246.9 lb

## 2022-10-23 DIAGNOSIS — S55101D Unspecified injury of radial artery at forearm level, right arm, subsequent encounter: Secondary | ICD-10-CM | POA: Diagnosis present

## 2022-10-23 NOTE — Progress Notes (Signed)
POST OPERATIVE OFFICE NOTE    CC: Sensory and motor deficits in the right forearm and hand  HPI:  This is a 46 y.o. female who is s/p thrombectomy of right radial artery with patch angioplasty to right radial artery on 12/16/2020 by Dr. Darrick Penna after cardiac cath on 12/10/2020.  Pt had some electrical type pains in forearm day of cath.  She has had continued numbness of the entire right hand and weakness. Embolectomy did not improve her sxs.  She was initially working with occupational therapy and gained minimal flexion and fifth digit, however has since stopped going to therapy and has lost flexion.     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  She has had several episodes of burning the hand while cooking, as well as smashing it in doors as it is completely insensate.  She is aware, but after several years, motor and sensation will not return.  She has asked for an amputation in an effort to get a prosthesis to make the right arm more functional..   Allergies  Allergen Reactions   Coconut Flavor Anaphylaxis and Swelling   Justicia Adhatoda Anaphylaxis   Pineapple Anaphylaxis and Swelling   Shrimp Extract Allergy Skin Test Anaphylaxis, Hives and Other (See Comments)   Metformin Diarrhea    Current Outpatient Medications  Medication Sig Dispense Refill   albuterol (VENTOLIN HFA) 108 (90 Base) MCG/ACT inhaler Inhale 2 puffs into the lungs every 6 (six) hours as needed for wheezing.     aspirin EC 81 MG tablet Take 1 tablet (81 mg total) by mouth daily. Swallow whole. 90 tablet 3   diclofenac (VOLTAREN) 75 MG EC tablet Take 1 tablet (75 mg total) by mouth 2 (two) times daily. 30 tablet 2   DULoxetine (CYMBALTA) 60 MG capsule Take 60 mg by mouth every morning.     EPINEPHrine 0.3 mg/0.3 mL IJ SOAJ injection Inject 0.3 mg into the muscle as needed for anaphylaxis. 1 each 0   FLUoxetine (PROZAC) 20 MG capsule Take  20 mg by mouth daily.     gabapentin (NEURONTIN) 400 MG capsule Take 400 mg by mouth at bedtime.     hydrochlorothiazide (HYDRODIURIL) 25 MG tablet Take 25 mg by mouth daily.     lisinopril (ZESTRIL) 10 MG tablet Take 10 mg by mouth daily.     meclizine (ANTIVERT) 25 MG tablet Take 12.5-25 mg by mouth 3 (three) times daily as needed.     Multiple Vitamins-Minerals (MULTIVITAMIN WITH MINERALS) tablet Take 1 tablet by mouth daily.     nitroGLYCERIN (NITROSTAT) 0.4 MG SL tablet Place 0.4 mg under the tongue every 5 (five) minutes as needed for chest pain.     omeprazole (PRILOSEC) 20 MG capsule Take 20 mg by mouth daily.     tiZANidine (ZANAFLEX) 4 MG tablet Take 4 mg by mouth every 6 (six) hours as needed.     traZODone (DESYREL) 50 MG tablet Take 50 mg by mouth at bedtime.     No current facility-administered medications for this visit.     ROS:  See HPI  Physical Exam:  Today's Vitals   10/23/22 0850  BP: 139/79  Pulse: 71  Resp: 20  Temp: 98.2 F (36.8 C)  SpO2: 96%  Weight: 246 lb 14.4 oz (112 kg)  Height: 5\' 5"  (1.651 m)   Body mass index is 41.09 kg/m.   Physical Exam Constitutional:  Appearance: Normal appearance.  HENT:     Head: Normocephalic and atraumatic.     Nose: No congestion.  Eyes:     General: No scleral icterus.    Pupils: Pupils are equal, round, and reactive to light.  Cardiovascular:     Rate and Rhythm: Normal rate.     Comments: Right hand - radial and ulnar signals, palmar arch signal and digit signals in the middle finger Abdominal:     General: There is no distension.     Tenderness: There is no abdominal tenderness.  Musculoskeletal:        General: No swelling.     Cervical back: Normal range of motion. No tenderness.     Comments: No sensory or motor in the right hand extending to the antecubital fossa  Skin:    General: Skin is warm and dry.  Neurological:     General: No focal deficit present.     Mental Status: She is alert  and oriented to person, place, and time.  Psychiatric:        Mood and Affect: Mood normal.        Behavior: Behavior normal.     Comments: depressed        Assessment/Plan:  This is a 46 y.o. female who is s/p thrombectomy of right radial artery with patch angioplasty to right radial artery on 12/16/2020 by Dr. Darrick Penna after cardiac catheterization.  Studies today demonstrate normal, triphasic waveforms to the level of the wrist with dampened radial waveforms consistent with previous intervention.  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  While these physical exam findings are not new, this has continued to cause a significant amount of depression.  She would like to the right hand and wrist amputated as she has had multiple injuries to the limb due to being insensate.  Victorino Dike and I had a long discussion regarding amputation.  She feels as though a prosthesis would provide a significant improvement in quality of life as she would be able to use her right arm again.  I called my colleague Dr. Frazier Butt, orthopedic hand surgery who has kindly agreed to see her in the outpatient setting to discuss amputation options further.   [facility] [phone] Total time of patient care including pre-visit research, consultation, and documentation greater than 20 minutes

## 2022-10-27 ENCOUNTER — Telehealth: Payer: Self-pay

## 2022-10-27 NOTE — Telephone Encounter (Signed)
Pt called stating that she saw Dr. Virl Cagey last week and he was supposed to have placed a referral to Dr. Madelynn Done office. She called that office this morning and they told her there was no referral in the system.  Reviewed pt's chart and last office visit on 10/23/22 with Dr. Virl Cagey states that he called and spoke directly with Dr. Tempie Donning. Spoke with Jeani Hawking to ensure a referral was to be placed in chart. She confirmed it would be.  Called pt, two identifiers used. Informed her that the referral was being entered in her chart and sent to Dr. Madelynn Done office. It may take a few days to finalize the process. Confirmed understanding.

## 2022-10-30 ENCOUNTER — Telehealth: Payer: Self-pay

## 2022-10-30 NOTE — Telephone Encounter (Signed)
Pt called stating that Dr. Raelene Bott is no longer with [facility] and they don't take her insurance. She has [employer].  Dr. Raelene Bott is now with [facility]. Called that office and spoke to someone in referrals. She stated that they do take [employer], but she isn't sure if it would be out or network for the pt.  Called pt, two identifiers used. Pt confirmed that she called her insurance and Sarah Rocha is out of network. [employer] gave her an in-network provider, Dr. Peggyann Juba with [facility]. She already has an appt with that office on 1/19. She requested that a new referral be sent to that office. Referral placed in Epic.

## 2022-11-07 ENCOUNTER — Other Ambulatory Visit: Payer: Commercial Managed Care - HMO

## 2022-11-10 ENCOUNTER — Ambulatory Visit: Payer: BLUE CROSS/BLUE SHIELD | Admitting: Orthopaedic Surgery
